# Patient Record
Sex: Female | Born: 1995 | Smoking: Never smoker
Health system: Southern US, Community
[De-identification: ages and names within clinical notes are randomized; demographics above are authoritative.]

## PROBLEM LIST (undated history)

## (undated) ENCOUNTER — Inpatient Hospital Stay (HOSPITAL_COMMUNITY): Payer: Self-pay

## (undated) DIAGNOSIS — R7989 Other specified abnormal findings of blood chemistry: Secondary | ICD-10-CM

## (undated) DIAGNOSIS — J45909 Unspecified asthma, uncomplicated: Secondary | ICD-10-CM

## (undated) DIAGNOSIS — O24419 Gestational diabetes mellitus in pregnancy, unspecified control: Secondary | ICD-10-CM

## (undated) DIAGNOSIS — O139 Gestational [pregnancy-induced] hypertension without significant proteinuria, unspecified trimester: Secondary | ICD-10-CM

## (undated) DIAGNOSIS — R748 Abnormal levels of other serum enzymes: Secondary | ICD-10-CM

## (undated) DIAGNOSIS — I10 Essential (primary) hypertension: Secondary | ICD-10-CM

## (undated) DIAGNOSIS — K8020 Calculus of gallbladder without cholecystitis without obstruction: Secondary | ICD-10-CM

## (undated) HISTORY — DX: Other specified abnormal findings of blood chemistry: R79.89

## (undated) HISTORY — DX: Abnormal levels of other serum enzymes: R74.8

## (undated) HISTORY — DX: Unspecified asthma, uncomplicated: J45.909

## (undated) HISTORY — DX: Essential (primary) hypertension: I10

## (undated) HISTORY — DX: Gestational diabetes mellitus in pregnancy, unspecified control: O24.419

## (undated) HISTORY — DX: Gestational (pregnancy-induced) hypertension without significant proteinuria, unspecified trimester: O13.9

## (undated) HISTORY — PX: WISDOM TOOTH EXTRACTION: SHX21

## (undated) HISTORY — DX: Calculus of gallbladder without cholecystitis without obstruction: K80.20

---

## 2019-03-25 DIAGNOSIS — Z3490 Encounter for supervision of normal pregnancy, unspecified, unspecified trimester: Secondary | ICD-10-CM | POA: Insufficient documentation

## 2019-03-27 LAB — OB RESULTS CONSOLE HIV ANTIBODY (ROUTINE TESTING): HIV: NONREACTIVE

## 2019-03-27 LAB — OB RESULTS CONSOLE ABO/RH: RH Type: NEGATIVE

## 2019-03-27 LAB — OB RESULTS CONSOLE ANTIBODY SCREEN: Antibody Screen: NEGATIVE

## 2019-03-27 LAB — OB RESULTS CONSOLE HEPATITIS B SURFACE ANTIGEN: Hepatitis B Surface Ag: NEGATIVE

## 2019-03-27 LAB — OB RESULTS CONSOLE GC/CHLAMYDIA
Chlamydia: NEGATIVE
Gonorrhea: NEGATIVE

## 2019-03-27 LAB — OB RESULTS CONSOLE RPR: RPR: NONREACTIVE

## 2019-03-27 LAB — OB RESULTS CONSOLE RUBELLA ANTIBODY, IGM: Rubella: IMMUNE

## 2019-04-05 NOTE — L&D Delivery Note (Addendum)
DELIVERY NOTE  Pt complete and at +2 station with urge to push. Epidural controlling pain. Pt pushed and delivered a viable female infant in LOA position. Anterior and posterior shoulders spontaneously delivered with next two pushes; body easily followed next. Infant placed on mothers abdomen and bulb suction of mouth and nose performed. Cord was then clamped and cut by FOB. Cord blood obtained, 3VC. Baby had a vigorous spontaneous cry noted. Placenta then delivered at 0322 intact. Fundal massage performed and pitocin per protocol. Fundus firm. The following lacerations were noted: right sulcal, second degree. Repaired in routine fashion with 2-0vicryl Mother and baby stable. Counts correct   Infant time: 0318 Gender: female Placenta time: 0322 Apgars: 9/9 Weight: pending skin-to-skin

## 2019-06-04 DIAGNOSIS — O99213 Obesity complicating pregnancy, third trimester: Secondary | ICD-10-CM | POA: Insufficient documentation

## 2019-07-12 ENCOUNTER — Inpatient Hospital Stay (HOSPITAL_COMMUNITY)
Admission: AD | Admit: 2019-07-12 | Payer: BC Managed Care – PPO | Source: Home / Self Care | Admitting: Obstetrics and Gynecology

## 2019-10-02 LAB — OB RESULTS CONSOLE GBS: GBS: POSITIVE

## 2019-10-11 ENCOUNTER — Telehealth (HOSPITAL_COMMUNITY): Payer: Self-pay | Admitting: *Deleted

## 2019-10-11 ENCOUNTER — Encounter (HOSPITAL_COMMUNITY): Payer: Self-pay | Admitting: *Deleted

## 2019-10-11 NOTE — Telephone Encounter (Signed)
Preadmission screen  

## 2019-10-14 ENCOUNTER — Encounter (HOSPITAL_COMMUNITY): Payer: Self-pay | Admitting: *Deleted

## 2019-10-22 ENCOUNTER — Other Ambulatory Visit (HOSPITAL_COMMUNITY)
Admission: RE | Admit: 2019-10-22 | Discharge: 2019-10-22 | Disposition: A | Discharge status: Home / Self Care | Payer: BC Managed Care – PPO | Source: Ambulatory Visit | Attending: Obstetrics and Gynecology | Admitting: Obstetrics and Gynecology

## 2019-10-22 DIAGNOSIS — Z20822 Contact with and (suspected) exposure to covid-19: Secondary | ICD-10-CM | POA: Insufficient documentation

## 2019-10-22 DIAGNOSIS — Z01812 Encounter for preprocedural laboratory examination: Secondary | ICD-10-CM | POA: Insufficient documentation

## 2019-10-22 LAB — SARS CORONAVIRUS 2 (TAT 6-24 HRS): SARS Coronavirus 2: NEGATIVE

## 2019-10-23 ENCOUNTER — Other Ambulatory Visit: Payer: Self-pay | Admitting: Obstetrics and Gynecology

## 2019-10-24 ENCOUNTER — Encounter (HOSPITAL_COMMUNITY): Payer: Self-pay | Admitting: Obstetrics and Gynecology

## 2019-10-24 ENCOUNTER — Other Ambulatory Visit: Payer: Self-pay

## 2019-10-24 ENCOUNTER — Inpatient Hospital Stay (HOSPITAL_COMMUNITY)
Admission: AD | Admit: 2019-10-24 | Discharge: 2019-10-26 | DRG: 807 | Disposition: A | Discharge status: Home / Self Care | Payer: BC Managed Care – PPO | Attending: Obstetrics and Gynecology | Admitting: Obstetrics and Gynecology

## 2019-10-24 ENCOUNTER — Inpatient Hospital Stay (HOSPITAL_COMMUNITY): Payer: BC Managed Care – PPO | Admitting: Anesthesiology

## 2019-10-24 ENCOUNTER — Inpatient Hospital Stay (HOSPITAL_COMMUNITY)
Admission: AD | Admit: 2019-10-24 | Payer: BC Managed Care – PPO | Source: Home / Self Care | Admitting: Obstetrics and Gynecology

## 2019-10-24 ENCOUNTER — Inpatient Hospital Stay (HOSPITAL_COMMUNITY): Payer: BC Managed Care – PPO

## 2019-10-24 DIAGNOSIS — O10919 Unspecified pre-existing hypertension complicating pregnancy, unspecified trimester: Secondary | ICD-10-CM | POA: Diagnosis present

## 2019-10-24 DIAGNOSIS — J45909 Unspecified asthma, uncomplicated: Secondary | ICD-10-CM | POA: Diagnosis present

## 2019-10-24 DIAGNOSIS — Z3A39 39 weeks gestation of pregnancy: Secondary | ICD-10-CM

## 2019-10-24 DIAGNOSIS — O99214 Obesity complicating childbirth: Secondary | ICD-10-CM | POA: Diagnosis present

## 2019-10-24 DIAGNOSIS — O1002 Pre-existing essential hypertension complicating childbirth: Principal | ICD-10-CM | POA: Diagnosis present

## 2019-10-24 DIAGNOSIS — O9952 Diseases of the respiratory system complicating childbirth: Secondary | ICD-10-CM | POA: Diagnosis present

## 2019-10-24 DIAGNOSIS — O26893 Other specified pregnancy related conditions, third trimester: Secondary | ICD-10-CM | POA: Diagnosis present

## 2019-10-24 DIAGNOSIS — Z6791 Unspecified blood type, Rh negative: Secondary | ICD-10-CM | POA: Diagnosis not present

## 2019-10-24 DIAGNOSIS — Z20822 Contact with and (suspected) exposure to covid-19: Secondary | ICD-10-CM | POA: Diagnosis present

## 2019-10-24 DIAGNOSIS — O99824 Streptococcus B carrier state complicating childbirth: Secondary | ICD-10-CM | POA: Diagnosis present

## 2019-10-24 LAB — CBC
HCT: 35.3 % — ABNORMAL LOW (ref 36.0–46.0)
HCT: 35.5 % — ABNORMAL LOW (ref 36.0–46.0)
Hemoglobin: 11.5 g/dL — ABNORMAL LOW (ref 12.0–15.0)
Hemoglobin: 11.6 g/dL — ABNORMAL LOW (ref 12.0–15.0)
MCH: 27.2 pg (ref 26.0–34.0)
MCH: 26.7 pg (ref 26.0–34.0)
MCHC: 32.6 g/dL (ref 30.0–36.0)
MCHC: 32.7 g/dL (ref 30.0–36.0)
MCV: 83.5 fL (ref 80.0–100.0)
MCV: 81.8 fL (ref 80.0–100.0)
Platelets: 237 10*3/uL (ref 150–400)
Platelets: 240 10*3/uL (ref 150–400)
RBC: 4.23 MIL/uL (ref 3.87–5.11)
RBC: 4.34 MIL/uL (ref 3.87–5.11)
RDW: 14.8 % (ref 11.5–15.5)
RDW: 14.9 % (ref 11.5–15.5)
WBC: 9.6 10*3/uL (ref 4.0–10.5)
WBC: 14 10*3/uL — ABNORMAL HIGH (ref 4.0–10.5)
nRBC: 0 % (ref 0.0–0.2)
nRBC: 0 % (ref 0.0–0.2)

## 2019-10-24 LAB — TYPE AND SCREEN
ABO/RH(D): O NEG
Antibody Screen: NEGATIVE

## 2019-10-24 LAB — ABO/RH: ABO/RH(D): O NEG

## 2019-10-24 LAB — RPR: RPR Ser Ql: NONREACTIVE

## 2019-10-24 MED ORDER — SOD CITRATE-CITRIC ACID 500-334 MG/5ML PO SOLN: 30.0000 mL | ORAL | Status: DC | PRN

## 2019-10-24 MED ORDER — FLEET ENEMA 7-19 GM/118ML RE ENEM: 1.0000 | ENEMA | RECTAL | Status: DC | PRN

## 2019-10-24 MED ORDER — LIDOCAINE HCL (PF) 1 % IJ SOLN
INTRAMUSCULAR | Status: DC | PRN
  Administered 2019-10-24 (×2): 5 mL via EPIDURAL

## 2019-10-24 MED ORDER — OXYTOCIN-SODIUM CHLORIDE 30-0.9 UT/500ML-% IV SOLN
1.0000 m[IU]/min | INTRAVENOUS | Status: DC
  Administered 2019-10-24: 2 m[IU]/min via INTRAVENOUS
  Filled 2019-10-24: qty 500

## 2019-10-24 MED ORDER — SODIUM CHLORIDE (PF) 0.9 % IJ SOLN
INTRAMUSCULAR | Status: DC | PRN
  Administered 2019-10-24: 12 mL/h via EPIDURAL

## 2019-10-24 MED ORDER — ACETAMINOPHEN 325 MG PO TABS: 650.0000 mg | ORAL_TABLET | ORAL | Status: DC | PRN

## 2019-10-24 MED ORDER — DIPHENHYDRAMINE HCL 50 MG/ML IJ SOLN: 12.5000 mg | INTRAMUSCULAR | Status: DC | PRN

## 2019-10-24 MED ORDER — OXYCODONE-ACETAMINOPHEN 5-325 MG PO TABS: 2.0000 | ORAL_TABLET | ORAL | Status: DC | PRN

## 2019-10-24 MED ORDER — OXYCODONE-ACETAMINOPHEN 5-325 MG PO TABS: 1.0000 | ORAL_TABLET | ORAL | Status: DC | PRN

## 2019-10-24 MED ORDER — OXYTOCIN BOLUS FROM INFUSION
333.0000 mL | Freq: Once | INTRAVENOUS | Status: AC
  Administered 2019-10-25: 333 mL via INTRAVENOUS

## 2019-10-24 MED ORDER — LACTATED RINGERS IV SOLN
500.0000 mL | Freq: Once | INTRAVENOUS | Status: AC
  Administered 2019-10-24: 500 mL via INTRAVENOUS

## 2019-10-24 MED ORDER — FENTANYL-BUPIVACAINE-NACL 0.5-0.125-0.9 MG/250ML-% EP SOLN
12.0000 mL/h | EPIDURAL | Status: DC | PRN
  Filled 2019-10-24: qty 250

## 2019-10-24 MED ORDER — PHENYLEPHRINE 40 MCG/ML (10ML) SYRINGE FOR IV PUSH (FOR BLOOD PRESSURE SUPPORT): 80.0000 ug | PREFILLED_SYRINGE | INTRAVENOUS | Status: DC | PRN

## 2019-10-24 MED ORDER — ONDANSETRON HCL 4 MG/2ML IJ SOLN: 4.0000 mg | Freq: Four times a day (QID) | INTRAMUSCULAR | Status: DC | PRN

## 2019-10-24 MED ORDER — LACTATED RINGERS IV SOLN: INTRAVENOUS | Status: DC

## 2019-10-24 MED ORDER — TERBUTALINE SULFATE 1 MG/ML IJ SOLN: 0.2500 mg | Freq: Once | INTRAMUSCULAR | Status: DC | PRN

## 2019-10-24 MED ORDER — OXYTOCIN-SODIUM CHLORIDE 30-0.9 UT/500ML-% IV SOLN: 2.5000 [IU]/h | INTRAVENOUS | Status: DC

## 2019-10-24 MED ORDER — LACTATED RINGERS IV SOLN: 500.0000 mL | INTRAVENOUS | Status: DC | PRN

## 2019-10-24 MED ORDER — PENICILLIN G POT IN DEXTROSE 60000 UNIT/ML IV SOLN
3.0000 10*6.[IU] | INTRAVENOUS | Status: DC
  Administered 2019-10-24 – 2019-10-25 (×4): 3 10*6.[IU] via INTRAVENOUS
  Filled 2019-10-24 (×4): qty 50

## 2019-10-24 MED ORDER — EPHEDRINE 5 MG/ML INJ: 10.0000 mg | INTRAVENOUS | Status: DC | PRN

## 2019-10-24 MED ORDER — LIDOCAINE HCL (PF) 1 % IJ SOLN
30.0000 mL | INTRAMUSCULAR | Status: AC | PRN
  Administered 2019-10-25: 30 mL via SUBCUTANEOUS
  Filled 2019-10-24: qty 30

## 2019-10-24 MED ORDER — MISOPROSTOL 25 MCG QUARTER TABLET
25.0000 ug | ORAL_TABLET | ORAL | Status: DC | PRN
  Administered 2019-10-24: 25 ug via VAGINAL
  Filled 2019-10-24: qty 1

## 2019-10-24 MED ORDER — SODIUM CHLORIDE 0.9 % IV SOLN
5.0000 10*6.[IU] | Freq: Once | INTRAVENOUS | Status: AC
  Administered 2019-10-24: 5 10*6.[IU] via INTRAVENOUS
  Filled 2019-10-24: qty 5

## 2019-10-24 NOTE — Progress Notes (Signed)
S/p clear AROM at 1340, initiate and titrate pitocin per protocol

## 2019-10-24 NOTE — Progress Notes (Signed)
Labor Note  S: FEelign some cramping, no other complaints  O: BP (!) 133/75   Pulse 86   Temp 98.6 F (37 C) (Oral)   Resp 16   Ht 5\' 6"  (1.676 m)   Wt 115.3 kg   BMI 41.03 kg/m  CE: 2/50/-3, softer and more anterior from admission FHR: Baseline 125, +accels, -decels, mod variability TOCO q2-58m  A/P: This is a 24 y.o. G1P0 at [redacted]w[redacted]d  admitted for IOL. CHTN no meds, GBS pos, RH neg, BMI 41 FWB: Cat 1 MWB: Comfortable Labor course: Cervical ripening. Will allow 2nd dose of PCN to start then plan on AROM and pitocin. S/p PV cytotec x1  Anticipate SVD

## 2019-10-24 NOTE — Progress Notes (Signed)
Feeling painful contractions, still not yet ready for epidural BP (!) 146/79   Pulse 87   Temp 98.9 F (37.2 C) (Oral)   Resp 19   Ht 5\' 6"  (1.676 m)   Wt 115.3 kg   BMI 41.03 kg/m  Ce 2/75/-3  Pitocin was at 6 now 4 mU/min 2/2 tachysystole. Now WNL, plan to titrate up.

## 2019-10-24 NOTE — Progress Notes (Signed)
Intermittent rectal rpessure with contractions BP (!) 139/71   Pulse 102   Temp 99 F (37.2 C) (Oral)   Resp 16   Ht 5\' 6"  (1.676 m)   Wt 115.3 kg   SpO2 96%   BMI 41.03 kg/m  Ce unchanged from RN exam at 1815, 3/75/-2 Pitocin at 10 mU/min, TOCO q39m IUPC placed, MVUs inadequate  Continue to titrate pitocin per protocol, trend MVUs. Afebrile, continue to monitor

## 2019-10-24 NOTE — H&P (Signed)
Brooke Oconnor is a 24 y.o. female presenting for scheduled IOL. +FM, denies VB, LOF, only occ ctx. PNC c/b obesity, RH neg, CHTN, asthma. OB History    Gravida  1   Para      Term      Preterm      AB      Living        SAB      TAB      Ectopic      Multiple      Live Births             Past Medical History:  Diagnosis Date  . Asthma   . Hypertension   . Pregnancy induced hypertension    Past Surgical History:  Procedure Laterality Date  . WISDOM TOOTH EXTRACTION     Family History: family history includes Diabetes in her maternal aunt, maternal grandmother, and mother; Heart disease in her paternal grandfather and paternal grandmother; Hypertension in her father, maternal grandmother, mother, paternal grandfather, and paternal grandmother. Social History:  reports that she has never smoked. She has never used smokeless tobacco. She reports previous alcohol use. She reports that she does not use drugs.     Maternal Diabetes: No Genetic Screening: Normal Maternal Ultrasounds/Referrals: Normal Fetal Ultrasounds or other Referrals:  None Maternal Substance Abuse:  No Significant Maternal Medications:  None Significant Maternal Lab Results:  Group B Strep positive Other Comments:  None  Review of Systems  Constitutional: Negative for chills and fever.  Respiratory: Negative for shortness of breath.   Cardiovascular: Negative for chest pain, palpitations and leg swelling.  Gastrointestinal: Negative for abdominal pain and vomiting.  Neurological: Negative for dizziness, weakness and headaches.  Psychiatric/Behavioral: Negative for suicidal ideas.   History   Blood pressure 136/77, pulse (!) 104, temperature 98.6 F (37 C), temperature source Oral, resp. rate 18, height 5\' 6"  (1.676 m), weight 115.3 kg. Exam Physical Exam Constitutional:      General: She is not in acute distress.    Appearance: She is well-developed.  HENT:     Head: Normocephalic  and atraumatic.  Eyes:     Pupils: Pupils are equal, round, and reactive to light.  Cardiovascular:     Rate and Rhythm: Normal rate and regular rhythm.     Heart sounds: No murmur heard.  No gallop.   Abdominal:     Tenderness: There is no abdominal tenderness. There is no guarding or rebound.  Genitourinary:    Vagina: Normal.  Musculoskeletal:        General: Normal range of motion.     Cervical back: Normal range of motion and neck supple.  Skin:    General: Skin is warm and dry.  Neurological:     Mental Status: She is alert and oriented to person, place, and time.     Prenatal labs: ABO, Rh: O/Negative/-- (12/23 0000) Antibody: Negative (12/23 0000) Rubella: Immune (12/23 0000) RPR: Nonreactive (12/23 0000)  HBsAg: Negative (12/23 0000)  HIV: Non-reactive (12/23 0000)  GBS: Positive/-- (06/30 0000)   Category 1 tracing, TOCO irr  Assessment/Plan: This is a 24yo G1P0 by LMP c/w 9 0/7 scan admitted for IOL for [facility] on no meds at term. PNC c/b GBS pos, RH neg (s/p rhogam 5/6), BMI 41. CE 1/50/-3, attempted FB but unable to place, PV cytotec x1 at this time.  Patient GS on 6/29 for S>D showed EFW 81.8%tile. Dystocia risks discussed with patient. Reviewed this occurs when head  delivers but anterior and posterior shoulders do not easily delivery with gentle traction. If this occurs, more personnel called in including NICU. Maneuvers may require break of bones, possible episiotomy or, in worst case, stat section. Higher chance of neonatal NICU admission if dystocia occurs. Patient understands and wishes to move forward with induction.   Brooke Oconnor 10/24/2019, 8:36 AM

## 2019-10-24 NOTE — Anesthesia Procedure Notes (Signed)
Epidural Patient location during procedure: OB Start time: 10/24/2019 5:26 PM End time: 10/24/2019 5:41 PM  Staffing Anesthesiologist: Heather Roberts, MD Performed: anesthesiologist   Preanesthetic Checklist Completed: patient identified, IV checked, site marked, risks and benefits discussed, monitors and equipment checked, pre-op evaluation and timeout performed  Epidural Patient position: sitting Prep: DuraPrep Patient monitoring: heart rate, cardiac monitor, continuous pulse ox and blood pressure Approach: midline Location: L2-L3 Injection technique: LOR saline  Needle:  Needle type: Tuohy  Needle gauge: 17 G Needle length: 9 cm Needle insertion depth: 6 cm Catheter size: 20 Guage Catheter at skin depth: 11 cm Test dose: negative and Other  Assessment Events: blood not aspirated, injection not painful, no injection resistance and negative IV test  Additional Notes Informed consent obtained prior to proceeding including risk of failure, 1% risk of PDPH, risk of minor discomfort and bruising.  Discussed rare but serious complications including epidural abscess, permanent nerve injury, epidural hematoma.  Discussed alternatives to epidural analgesia and patient desires to proceed.  Timeout performed pre-procedure verifying patient name, procedure, and platelet count.  Patient tolerated procedure well.

## 2019-10-24 NOTE — Anesthesia Preprocedure Evaluation (Signed)
Anesthesia Evaluation  Patient identified by MRN, date of birth, ID band Patient awake    Reviewed: Allergy & Precautions, NPO status , Patient's Chart, lab work & pertinent test results  Airway Mallampati: III  TM Distance: >3 FB Neck ROM: Full    Dental no notable dental hx. (+) Dental Advisory Given   Pulmonary neg pulmonary ROS,    Pulmonary exam normal        Cardiovascular hypertension, Normal cardiovascular exam     Neuro/Psych negative neurological ROS  negative psych ROS   GI/Hepatic negative GI ROS, Neg liver ROS,   Endo/Other  Morbid obesity  Renal/GU negative Renal ROS  negative genitourinary   Musculoskeletal negative musculoskeletal ROS (+)   Abdominal   Peds negative pediatric ROS (+)  Hematology negative hematology ROS (+)   Anesthesia Other Findings   Reproductive/Obstetrics (+) Pregnancy                             Anesthesia Physical Anesthesia Plan  ASA: III  Anesthesia Plan: Epidural   Post-op Pain Management:    Induction:   PONV Risk Score and Plan:   Airway Management Planned: Natural Airway  Additional Equipment:   Intra-op Plan:   Post-operative Plan:   Informed Consent: I have reviewed the patients History and Physical, chart, labs and discussed the procedure including the risks, benefits and alternatives for the proposed anesthesia with the patient or authorized representative who has indicated his/her understanding and acceptance.     Dental advisory given  Plan Discussed with: Anesthesiologist  Anesthesia Plan Comments:         Anesthesia Quick Evaluation

## 2019-10-25 LAB — CBC
HCT: 32.6 % — ABNORMAL LOW (ref 36.0–46.0)
HCT: 29.1 % — ABNORMAL LOW (ref 36.0–46.0)
Hemoglobin: 10.9 g/dL — ABNORMAL LOW (ref 12.0–15.0)
Hemoglobin: 9.4 g/dL — ABNORMAL LOW (ref 12.0–15.0)
MCH: 27.3 pg (ref 26.0–34.0)
MCH: 26.7 pg (ref 26.0–34.0)
MCHC: 33.4 g/dL (ref 30.0–36.0)
MCHC: 32.3 g/dL (ref 30.0–36.0)
MCV: 81.5 fL (ref 80.0–100.0)
MCV: 82.7 fL (ref 80.0–100.0)
Platelets: 228 10*3/uL (ref 150–400)
Platelets: 198 10*3/uL (ref 150–400)
RBC: 4 MIL/uL (ref 3.87–5.11)
RBC: 3.52 MIL/uL — ABNORMAL LOW (ref 3.87–5.11)
RDW: 14.6 % (ref 11.5–15.5)
RDW: 14.9 % (ref 11.5–15.5)
WBC: 20.6 10*3/uL — ABNORMAL HIGH (ref 4.0–10.5)
WBC: 14.3 10*3/uL — ABNORMAL HIGH (ref 4.0–10.5)
nRBC: 0 % (ref 0.0–0.2)
nRBC: 0 % (ref 0.0–0.2)

## 2019-10-25 LAB — COMPREHENSIVE METABOLIC PANEL
ALT: 13 U/L (ref 0–44)
AST: 22 U/L (ref 15–41)
Albumin: 2.2 g/dL — ABNORMAL LOW (ref 3.5–5.0)
Alkaline Phosphatase: 93 U/L (ref 38–126)
Anion gap: 9 (ref 5–15)
BUN: 8 mg/dL (ref 6–20)
CO2: 24 mmol/L (ref 22–32)
Calcium: 9.1 mg/dL (ref 8.9–10.3)
Chloride: 105 mmol/L (ref 98–111)
Creatinine, Ser: 1.02 mg/dL — ABNORMAL HIGH (ref 0.44–1.00)
GFR calc Af Amer: >60 mL/min (ref >60)
GFR calc non Af Amer: >60 mL/min (ref >60)
Glucose, Bld: 92 mg/dL (ref 70–99)
Potassium: 3.7 mmol/L (ref 3.5–5.1)
Sodium: 138 mmol/L (ref 135–145)
Total Bilirubin: 0.2 mg/dL — ABNORMAL LOW (ref 0.3–1.2)
Total Protein: 5.3 g/dL — ABNORMAL LOW (ref 6.5–8.1)

## 2019-10-25 MED ORDER — ONDANSETRON HCL 4 MG PO TABS: 4.0000 mg | ORAL_TABLET | ORAL | Status: DC | PRN

## 2019-10-25 MED ORDER — ACETAMINOPHEN 325 MG PO TABS: 650.0000 mg | ORAL_TABLET | ORAL | Status: DC | PRN

## 2019-10-25 MED ORDER — SIMETHICONE 80 MG PO CHEW: 80.0000 mg | CHEWABLE_TABLET | ORAL | Status: DC | PRN

## 2019-10-25 MED ORDER — ONDANSETRON HCL 4 MG/2ML IJ SOLN: 4.0000 mg | INTRAMUSCULAR | Status: DC | PRN

## 2019-10-25 MED ORDER — NIFEDIPINE ER OSMOTIC RELEASE 30 MG PO TB24
30.0000 mg | ORAL_TABLET | Freq: Every day | ORAL | Status: DC
  Administered 2019-10-25 – 2019-10-26 (×2): 30 mg via ORAL
  Filled 2019-10-25 (×2): qty 1

## 2019-10-25 MED ORDER — SENNOSIDES-DOCUSATE SODIUM 8.6-50 MG PO TABS
2.0000 | ORAL_TABLET | ORAL | Status: DC
  Administered 2019-10-26: 2 via ORAL
  Filled 2019-10-25: qty 2

## 2019-10-25 MED ORDER — ZOLPIDEM TARTRATE 5 MG PO TABS: 5.0000 mg | ORAL_TABLET | Freq: Every evening | ORAL | Status: DC | PRN

## 2019-10-25 MED ORDER — DIPHENHYDRAMINE HCL 25 MG PO CAPS: 25.0000 mg | ORAL_CAPSULE | Freq: Four times a day (QID) | ORAL | Status: DC | PRN

## 2019-10-25 MED ORDER — IBUPROFEN 600 MG PO TABS
600.0000 mg | ORAL_TABLET | Freq: Four times a day (QID) | ORAL | Status: DC
  Administered 2019-10-25 – 2019-10-26 (×6): 600 mg via ORAL
  Filled 2019-10-25 (×6): qty 1

## 2019-10-25 MED ORDER — BENZOCAINE-MENTHOL 20-0.5 % EX AERO
1.0000 "application " | INHALATION_SPRAY | CUTANEOUS | Status: DC | PRN
  Administered 2019-10-25 – 2019-10-26 (×2): 1 via TOPICAL
  Filled 2019-10-25 (×2): qty 56

## 2019-10-25 MED ORDER — PRENATAL MULTIVITAMIN CH
1.0000 | ORAL_TABLET | Freq: Every day | ORAL | Status: DC
  Administered 2019-10-25 – 2019-10-26 (×2): 1 via ORAL
  Filled 2019-10-25 (×2): qty 1

## 2019-10-25 MED ORDER — WITCH HAZEL-GLYCERIN EX PADS: 1.0000 "application " | MEDICATED_PAD | CUTANEOUS | Status: DC | PRN

## 2019-10-25 MED ORDER — DIBUCAINE (PERIANAL) 1 % EX OINT: 1.0000 "application " | TOPICAL_OINTMENT | CUTANEOUS | Status: DC | PRN

## 2019-10-25 MED ORDER — COCONUT OIL OIL: 1.0000 "application " | TOPICAL_OIL | Status: DC | PRN

## 2019-10-25 NOTE — Lactation Note (Signed)
This note was copied from a baby's chart. Lactation Consultation Note  Patient Name: Brooke Tanea Oconnor Today's Date: 10/25/2019 Reason for consult: Initial assessment;Term;Primapara;1st time breastfeeding  P1 mother whose infant is now 11 hours old.  This is a term baby at 39+2 weeks.  Mother was sitting in the chair and getting ready to feed baby.  Offered to assist and mother accepted.  Baby had on a onesie; asked permission to remove clothing and feed STS.  Mother agreeable.  Discussed the benefits of feeding STS.    Mother's breasts are soft and non tender and nipples are short shafted, everted and intact.  Mother was unable to express any colostrum drops at this time.  Colostrum container provided and milk storage times reviewed.  Finger feeding demonstrated.  Encouraged to feed 8-12 times/24 hours or sooner if desired.  Mother will call her RN/LC for latch assistance as needed.  Basic breast feeding concepts reviewed included STS, tummy size, latching and how to awaken a sleepy baby.  Suggested mother continue hand expression before/after feedings to help stimulate breasts.    Mom made aware of O/P services, breastfeeding support groups, community resources, and our phone # for post-discharge questions. Mother has a DEBP for home use.  Father and grandmother are good support people for mother.      Maternal Data Formula Feeding for Exclusion: No Has patient been taught Hand Expression?: Yes Does the patient have breastfeeding experience prior to this delivery?: No  Feeding Feeding Type: Breast Fed  LATCH Score Latch: Too sleepy or reluctant, no latch achieved, no sucking elicited.  Audible Swallowing: None  Type of Nipple: Everted at rest and after stimulation (short shafted)  Comfort (Breast/Nipple): Soft / non-tender  Hold (Positioning): Assistance needed to correctly position infant at breast and maintain latch.  LATCH Score: 5  Interventions Interventions: Breast  feeding basics reviewed;Assisted with latch;Skin to skin;Breast massage;Hand express;Breast compression;Adjust position;Hand pump;Position options;Support pillows  Lactation Tools Discussed/Used Pump Review: Setup, frequency, and cleaning;Milk Storage Initiated by:: Javan Gonzaga Date initiated:: 10/25/19   Consult Status Consult Status: Follow-up Date: 10/26/19 Follow-up type: In-patient    Tiney Zipper R Sevannah Madia 10/25/2019, 2:01 PM

## 2019-10-25 NOTE — Progress Notes (Signed)
Contacted Dr. Ellyn Hack regarding trending blood pressures of patient in 140s/80s, no other s/s. Pt hx of CHTN. Verbal order to start oral procardia 30mg  and order for CBC and CMP.

## 2019-10-25 NOTE — Anesthesia Postprocedure Evaluation (Signed)
Anesthesia Post Note  Patient: Brooke Oconnor  Procedure(s) Performed: AN AD HOC LABOR EPIDURAL     Patient location during evaluation: Mother Baby Anesthesia Type: Epidural Level of consciousness: awake and awake and alert Pain management: pain level controlled Vital Signs Assessment: post-procedure vital signs reviewed and stable Respiratory status: spontaneous breathing Cardiovascular status: blood pressure returned to baseline Postop Assessment: no headache, no backache, epidural receding, patient able to bend at knees, able to ambulate, adequate PO intake and no apparent nausea or vomiting Anesthetic complications: no   No complications documented.  Last Vitals:  Vitals:   10/25/19 0545 10/25/19 0645  BP: (!) 143/79 (!) 140/64  Pulse: (!) 108 (!) 110  Resp: 18 18  Temp: 37.1 C 37.2 C  SpO2: 99% 98%    Last Pain:  Vitals:   10/25/19 0730  TempSrc:   PainSc: 0-No pain   Pain Goal:                Epidural/Spinal Function Cutaneous sensation: Normal sensation (10/25/19 0730), Patient able to flex knees: Yes (10/25/19 0730), Patient able to lift hips off bed: Yes (10/25/19 0730), Back pain beyond tenderness at insertion site: No (10/25/19 0730), Progressively worsening motor and/or sensory loss: No (10/25/19 0730), Bowel and/or bladder incontinence post epidural: No (10/25/19 0730)  Jennelle Human

## 2019-10-25 NOTE — Progress Notes (Signed)
Post Partum Day 0 Subjective: no complaints, up ad lib, voiding, tolerating PO and nl lochia, pain controleld  Objective: Blood pressure (!) 140/64, pulse (!) 110, temperature 98.9 F (37.2 C), temperature source Oral, resp. rate 18, height 5\' 6"  (1.676 m), weight 115.3 kg, SpO2 98 %.  Physical Exam:  General: alert and no distress Lochia: appropriate Uterine Fundus: firm  Recent Labs    10/24/19 1451 10/25/19 0449  HGB 11.6* 10.9*  HCT 35.5* 32.6*    Assessment/Plan: Breastfeeding and Lactation consult.  Routine PP care.     LOS: 1 day   Brooke Oconnor 10/25/2019, 7:59 AM

## 2019-10-25 NOTE — Progress Notes (Signed)
Patient feeling strong urge to push, rectal pressure, Complete/+1. TOCO q44m, pit at 12 mU/min. Set up at this time BP 128/65   Pulse 103   Temp 98.8 F (37.1 C) (Oral)   Resp 16   Ht 5\' 6"  (1.676 m)   Wt 115.3 kg   SpO2 96%   BMI 41.03 kg/m

## 2019-10-26 MED ORDER — PRENATAL 27-0.8 MG PO TABS: 1.0000 | ORAL_TABLET | Freq: Every day | ORAL | 3 refills | Status: DC

## 2019-10-26 MED ORDER — IBUPROFEN 600 MG PO TABS: 600.0000 mg | ORAL_TABLET | Freq: Four times a day (QID) | ORAL | 1 refills | Status: DC

## 2019-10-26 MED ORDER — NIFEDIPINE ER 30 MG PO TB24: 30.0000 mg | ORAL_TABLET | Freq: Every day | ORAL | 2 refills | Status: DC

## 2019-10-26 NOTE — Lactation Note (Signed)
This note was copied from a baby's chart. Lactation Consultation Note  Patient Name: Brooke Oconnor [id number] Date: 10/26/2019 Reason for consult: Follow-up assessment   P1, Baby 32 hours old.  Reviewed hand expression and positioning w/ mother. Feed on demand with cues.  Goal 8-12+ times per day after first 24 hrs.  Place baby STS if not cueing.  Suggest hand expressing before latching and post pumping a few times a day after breastfeeding to increase mother's milk supply until first weight check.  Give volume back to baby on spoon and later w/ slow flow nipple.  Reviewed engorgement care and monitoring voids/stools.    Maternal Data Has patient been taught Hand Expression?: Yes Does the patient have breastfeeding experience prior to this delivery?: No  Feeding Feeding Type: Breast Fed  LATCH Score Latch: Grasps breast easily, tongue down, lips flanged, rhythmical sucking.  Audible Swallowing: A few with stimulation  Type of Nipple: Everted at rest and after stimulation  Comfort (Breast/Nipple): Soft / non-tender  Hold (Positioning): Assistance needed to correctly position infant at breast and maintain latch.  LATCH Score: 8  Interventions Interventions: Breast feeding basics reviewed;Assisted with latch;Support pillows  Lactation Tools Discussed/Used     Consult Status Consult Status: Complete Date: 10/26/19    Dahlia Byes [facility] 10/26/2019, 12:08 PM

## 2019-10-26 NOTE — Progress Notes (Signed)
Post Partum Day 1 Subjective: no complaints, up ad lib, voiding, tolerating PO and nl lochia, pain controlled.  Tolerating blood loss well.  Elevated BP in PNC 130-150/70-80;s. Started on Procardia 30 daily yesterday.    Objective: Blood pressure 127/82, pulse 98, temperature 98 F (36.7 C), temperature source Oral, resp. rate 18, height 5\' 6"  (1.676 m), weight 115.3 kg, SpO2 100 %.  Physical Exam:  General: alert and no distress Lochia: appropriate Uterine Fundus: firm   Recent Labs    10/25/19 0449 10/25/19 1806  HGB 10.9* 9.4*  HCT 32.6* 29.1*    Assessment/Plan: Plan for discharge tomorrow, Breastfeeding and Lactation consult.  Routine care.   Desires d/c home.  Can follow BP at home, no sx's.  Will d/c with Motrin, Procardia and PNV.  F/u 1 and 6 weeks.    LOS: 2 days   Brooke Oconnor 10/26/2019, 6:46 AM

## 2019-10-26 NOTE — Discharge Summary (Signed)
Postpartum Discharge Summary     Patient Name: Brooke Oconnor DOB: 10/13/1995 MRN: [id number]  Date of admission: 10/24/2019 Delivery date:10/25/2019  Delivering provider: Deliah Boston  Date of discharge: 10/26/2019  Admitting diagnosis: Chronic hypertension affecting pregnancy [O10.919] Intrauterine pregnancy: [redacted]w[redacted]d    Secondary diagnosis:  Active Problems:   Chronic hypertension affecting pregnancy  Additional problems: N/A    Discharge diagnosis: Term Pregnancy Delivered                                              Post partum procedures:none - baby o neg Augmentation: AROM, Pitocin and Cytotec Complications: None  Hospital course: Induction of Labor With Vaginal Delivery   24y.o. yo G1P0 at 355w3das admitted to the hospital 10/24/2019 for induction of labor.  Indication for induction: CHTN.  Patient had an uncomplicated labor course as follows: Membrane Rupture Time/Date: 1:38 PM ,10/24/2019   Delivery Method:Vaginal, Spontaneous  Episiotomy: None  Lacerations:  Sulcus;2nd degree  Details of delivery can be found in separate delivery note.  Patient had a routine postpartum course. Patient is discharged home 10/26/19.  Newborn Data: Birth date:10/25/2019  Birth time:3:18 AM  Gender:Female  Living status:Living  Apgars:8 ,9  Weight:4006 g   Magnesium Sulfate received: No BMZ received: No Rhophylac:No MMR:No T-DaP:allergic Flu: N/A Transfusion:No  Physical exam  Vitals:   10/25/19 1444 10/25/19 1810 10/25/19 2152 10/26/19 0526  BP: (!) 140/69 (!) 131/81 (!) 134/83 127/82  Pulse:  98 99 98  Resp: '18 18 18 18  ' Temp: 98.8 F (37.1 C) 98.1 F (36.7 C) 98 F (36.7 C) 98 F (36.7 C)  TempSrc: Oral Oral Oral Oral  SpO2: 99% 100% 100% 100%  Weight:      Height:       General: alert, cooperative and no distress Lochia: appropriate Uterine Fundus: firm  Labs: Lab Results  Component Value Date   WBC 14.3 (H) 10/25/2019   HGB 9.4 (L) 10/25/2019   HCT  29.1 (L) 10/25/2019   MCV 82.7 10/25/2019   PLT 198 10/25/2019   CMP Latest Ref Rng & Units 10/25/2019  Glucose 70 - 99 mg/dL 92  BUN 6 - 20 mg/dL 8  Creatinine 0.44 - 1.00 mg/dL 1.02(H)  Sodium 135 - 145 mmol/L 138  Potassium 3.5 - 5.1 mmol/L 3.7  Chloride 98 - 111 mmol/L 105  CO2 22 - 32 mmol/L 24  Calcium 8.9 - 10.3 mg/dL 9.1  Total Protein 6.5 - 8.1 g/dL 5.3(L)  Total Bilirubin 0.3 - 1.2 mg/dL 0.2(L)  Alkaline Phos 38 - 126 U/L 93  AST 15 - 41 U/L 22  ALT 0 - 44 U/L 13   Edinburgh Score: Edinburgh Postnatal Depression Scale Screening Tool 10/25/2019  I have been able to laugh and see the funny side of things. (No Data)     After visit meds:  Allergies as of 10/26/2019      Reactions   Pertussis Vaccines       Medication List    STOP taking these medications   acetaminophen 325 MG tablet Commonly known as: TYLENOL   aspirin EC 81 MG tablet     TAKE these medications   ibuprofen 600 MG tablet Commonly known as: ADVIL Take 1 tablet (600 mg total) by mouth every 6 (six) hours.   multivitamin-prenatal 27-0.8 MG Tabs tablet Take  1 tablet by mouth daily at 12 noon.   NIFEdipine 30 MG 24 hr tablet Commonly known as: ADALAT CC Take 1 tablet (30 mg total) by mouth daily.        Discharge home in stable condition Infant Feeding: Breast Infant Disposition:home with mother Discharge instruction: per After Visit Summary and Postpartum booklet. Activity: Advance as tolerated. Pelvic rest for 6 weeks.  Diet: routine diet Future Appointments:No future appointments. Follow up Visit:  Follow-up Information    Shivaji, Melida Quitter, MD. Schedule an appointment as soon as possible for a visit in 1 week(s).   Specialty: Obstetrics and Gynecology Why: for BP check and 6 weeks for postpartum check Contact information: [address] [place] [address] [phone]                F/u 1 week for BP check 6week for PP check  10/26/2019 Janyth Contes, MD

## 2019-12-16 ENCOUNTER — Encounter: Payer: Self-pay | Admitting: *Deleted

## 2019-12-17 ENCOUNTER — Ambulatory Visit (INDEPENDENT_AMBULATORY_CARE_PROVIDER_SITE_OTHER): Payer: BC Managed Care – PPO | Admitting: Internal Medicine

## 2019-12-17 ENCOUNTER — Encounter: Payer: Self-pay | Admitting: Internal Medicine

## 2019-12-17 ENCOUNTER — Other Ambulatory Visit (INDEPENDENT_AMBULATORY_CARE_PROVIDER_SITE_OTHER): Payer: BC Managed Care – PPO

## 2019-12-17 VITALS — BP 114/70 | HR 60 | Ht 66.0 in | Wt 218.6 lb

## 2019-12-17 DIAGNOSIS — R748 Abnormal levels of other serum enzymes: Secondary | ICD-10-CM | POA: Diagnosis not present

## 2019-12-17 DIAGNOSIS — R1013 Epigastric pain: Secondary | ICD-10-CM | POA: Diagnosis not present

## 2019-12-17 LAB — COMPREHENSIVE METABOLIC PANEL
ALT: 27 U/L (ref 0–35)
AST: 18 U/L (ref 0–37)
Albumin: 4.4 g/dL (ref 3.5–5.2)
Alkaline Phosphatase: 162 U/L — ABNORMAL HIGH (ref 39–117)
BUN: 16 mg/dL (ref 6–23)
CO2: 26 mEq/L (ref 19–32)
Calcium: 9.6 mg/dL (ref 8.4–10.5)
Chloride: 106 mEq/L (ref 96–112)
Creatinine, Ser: 0.79 mg/dL (ref 0.40–1.20)
GFR: 87.78 mL/min (ref >60.00)
Glucose, Bld: 87 mg/dL (ref 70–99)
Potassium: 4.1 mEq/L (ref 3.5–5.1)
Sodium: 140 mEq/L (ref 135–145)
Total Bilirubin: 0.3 mg/dL (ref 0.2–1.2)
Total Protein: 7.4 g/dL (ref 6.0–8.3)

## 2019-12-17 LAB — CBC WITH DIFFERENTIAL/PLATELET
Basophils Absolute: 0 10*3/uL (ref 0.0–0.1)
Basophils Relative: 0.3 % (ref 0.0–3.0)
Eosinophils Absolute: 0.1 10*3/uL (ref 0.0–0.7)
Eosinophils Relative: 1.9 % (ref 0.0–5.0)
HCT: 38.8 % (ref 36.0–46.0)
Hemoglobin: 13.1 g/dL (ref 12.0–15.0)
Lymphocytes Relative: 33.4 % (ref 12.0–46.0)
Lymphs Abs: 2.2 10*3/uL (ref 0.7–4.0)
MCHC: 33.7 g/dL (ref 30.0–36.0)
MCV: 80.2 fl (ref 78.0–100.0)
Monocytes Absolute: 0.6 10*3/uL (ref 0.1–1.0)
Monocytes Relative: 8.5 % (ref 3.0–12.0)
Neutro Abs: 3.7 10*3/uL (ref 1.4–7.7)
Neutrophils Relative %: 55.9 % (ref 43.0–77.0)
Platelets: 241 10*3/uL (ref 150.0–400.0)
RBC: 4.84 Mil/uL (ref 3.87–5.11)
RDW: 15.5 % (ref 11.5–15.5)
WBC: 6.6 10*3/uL (ref 4.0–10.5)

## 2019-12-17 LAB — PROTIME-INR
INR: 1.1 ratio — ABNORMAL HIGH (ref 0.8–1.0)
Prothrombin Time: 11.9 s (ref 9.6–13.1)

## 2019-12-17 LAB — FERRITIN: Ferritin: 36.4 ng/mL (ref 10.0–291.0)

## 2019-12-17 NOTE — Patient Instructions (Addendum)
If you are age 26 or older, your body mass index should be between 23-30. Your Body mass index is 35.28 kg/m. If this is out of the aforementioned range listed, please consider follow up with your Primary Care Provider.  If you are age 75 or younger, your body mass index should be between 19-25. Your Body mass index is 35.28 kg/m. If this is out of the aformentioned range listed, please consider follow up with your Primary Care Provider.   Your provider has requested that you go to the basement level for lab work before leaving today. Press "B" on the elevator. The lab is located at the first door on the left as you exit the elevator.  You have been scheduled for an abdominal ultrasound at [facility] (1st floor of hospital) on 12/23/19 at 11:30am. Please arrive 15 minutes prior to your appointment for registration. Make certain not to have anything to eat or drink 6 hours prior to your appointment. Should you need to reschedule your appointment, please contact radiology at [phone]. This test typically takes about 30 minutes to perform.  Continue Colace as needed for constipation

## 2019-12-17 NOTE — Progress Notes (Signed)
Patient ID: Brooke Oconnor, female   DOB: 1995-09-10, 24 y.o.   MRN: [id number] HPI: Brooke Oconnor is a 24 year old female with a history of asthma, recent pregnancy status post spontaneous vaginal delivery on 10/25/2019, hypertension during pregnancy who is seen in consultation at the request of Dr. Venetia Maxon to evaluate elevated liver enzymes and constipation.  She is here alone today.  She reports that she had her first child, a daughter, on 10/25/2019.  This was a spontaneous vaginal delivery with a second-degree vaginal tear which was repaired.  The child was healthy and she has been breast-feeding.  Her postpartum period was unremarkable until 11/11/2019 when she developed epigastric abdominal pain.  This epigastric pain was crescendo in nature and became severe rated 10 out of 10.  It radiated to her back between her shoulder blades.  There was associated nausea and an episode of vomiting.  The vomiting seemed to help the epigastric pressure but the pain did not completely resolved.  She was transported to the emergency room at [facility].  Labs there showed a mild elevation in AST at 43 and alk phos was elevated at 155.  White count was slightly elevated at 13.3 while hemoglobin and platelets were normal.  Chest x-ray was done which was unremarkable and she left before being further evaluated.  At that time the triage nurse suggested that this may have been constipation related.  The painful episode did finally resolve after about 12 hours.  She is subsequently had intermittent attacks though not as severe as that first.  She had another attack on 11/14/2019.  And has had this occur roughly once a week since then.  Associated with mild nausea but no other vomiting episodes.  Pain can rate on a scale from 7-8 out of 10.  Still is crescendo decrescendo and tends to happen in the evening.  Has not happened in the middle of the night.  She has been on Colace and/or MiraLAX since her initial episode.  Most recently Colace  most days of the week.  Stools have normalized for the most part and are typically daily.  Occasionally she can have a harder stool.  She had additional lab work where her liver enzymes became more elevated as follows: Labs dated 11/13/2019 --AST 38, ALT 114, total bili 0.2, alkaline phosphatase 185 Labs dated 11/20/2019 --AST 595, ALT 553, total bili 0.2, alkaline phosphatase 329 Labs dated 11/26/2019 --AST 20, ALT 68, total bili 0.2, alkaline phosphatase 199    Past Medical History:  Diagnosis Date  . Asthma   . Elevated alkaline phosphatase level   . Elevated LFTs   . Hypertension   . Pregnancy induced hypertension     Past Surgical History:  Procedure Laterality Date  . WISDOM TOOTH EXTRACTION      Outpatient Medications Prior to Visit  Medication Sig Dispense Refill  . docusate sodium (COLACE) 100 MG capsule Take 100 mg by mouth daily.    Marland Kitchen ibuprofen (ADVIL) 600 MG tablet Take 1 tablet (600 mg total) by mouth every 6 (six) hours. 45 tablet 1  . NIFEdipine (ADALAT CC) 30 MG 24 hr tablet Take 1 tablet (30 mg total) by mouth daily. 30 tablet 2  . Prenatal Vit-Fe Fumarate-FA (MULTIVITAMIN-PRENATAL) 27-0.8 MG TABS tablet Take 1 tablet by mouth daily at 12 noon. 100 tablet 3   No facility-administered medications prior to visit.    Allergies  Allergen Reactions  . Pertussis Vaccines     Family History  Problem Relation Age of  Onset  . Diabetes Mother   . Hypertension Mother   . Thyroid disease Mother   . Hypertension Father   . Diabetes Maternal Aunt   . Thyroid disease Maternal Aunt   . Diabetes Maternal Grandmother   . Hypertension Maternal Grandmother   . Thyroid disease Maternal Grandmother   . Heart attack Maternal Grandmother   . Heart disease Paternal Grandmother   . Hypertension Paternal Grandmother   . Heart disease Paternal Grandfather   . Hypertension Paternal Grandfather   . Diabetes Brother   . Lung cancer Maternal Grandfather   . Heart attack Maternal  Grandfather     Social History   Tobacco Use  . Smoking status: Never Smoker  . Smokeless tobacco: Never Used  Vaping Use  . Vaping Use: Never used  Substance Use Topics  . Alcohol use: Not Currently  . Drug use: Never    ROS: As per history of present illness, otherwise negative  BP 114/70   Pulse 60   Ht _0  (1.676 m)   Wt 218 lb 9.6 oz (99.2 kg)   BMI 35.28 kg/m  Constitutional: Well-developed and well-nourished. No distress. HEENT: Normocephalic and atraumatic. Conjunctivae are normal.  No scleral icterus. Neck: Neck supple. Trachea midline. Cardiovascular: Normal rate, regular rhythm and intact distal pulses. No M/R/G Pulmonary/chest: Effort normal and breath sounds normal. No wheezing, rales or rhonchi. Abdominal: Soft, nontender, nondistended. Bowel sounds active throughout. There are no masses palpable. No hepatosplenomegaly. Extremities: no clubbing, cyanosis, or edema Neurological: Alert and oriented to person place and time. Skin: Skin is warm and dry.  Psychiatric: Normal mood and affect. Behavior is normal.  RELEVANT LABS AND IMAGING: CBC    Component Value Date/Time   WBC 6.6 12/17/2019 0929   RBC 4.84 12/17/2019 0929   HGB 13.1 12/17/2019 0929   HCT 38.8 12/17/2019 0929   PLT 241.0 12/17/2019 0929   MCV 80.2 12/17/2019 0929   MCH 26.7 10/25/2019 1806   MCHC 33.7 12/17/2019 0929   RDW 15.5 12/17/2019 0929   LYMPHSABS 2.2 12/17/2019 0929   MONOABS 0.6 12/17/2019 0929   EOSABS 0.1 12/17/2019 0929   BASOSABS 0.0 12/17/2019 0929    CMP     Component Value Date/Time   NA 140 12/17/2019 0929   K 4.1 12/17/2019 0929   CL 106 12/17/2019 0929   CO2 26 12/17/2019 0929   GLUCOSE 87 12/17/2019 0929   BUN 16 12/17/2019 0929   CREATININE 0.79 12/17/2019 0929   CALCIUM 9.6 12/17/2019 0929   PROT 7.4 12/17/2019 0929   ALBUMIN 4.4 12/17/2019 0929   AST 18 12/17/2019 0929   ALT 27 12/17/2019 0929   ALKPHOS 162 (H) 12/17/2019 0929   BILITOT 0.3  12/17/2019 0929   GFRNONAA >60 10/25/2019 1806   GFRAA >60 10/25/2019 1806   Lab Results  Component Value Date   INR 1.1 (H) 12/17/2019    ASSESSMENT/PLAN: 24 year old female with a history of asthma, recent pregnancy status post spontaneous vaginal delivery on 10/25/2019, hypertension during pregnancy who is seen in consultation at the request of Dr. Venetia Maxon to evaluate elevated liver enzymes and constipation.  1.  Elevated liver enzymes/epigastric abdominal pain episodes --it is notable important that her pain and liver enzyme abnormality occurred after delivery rather than before the birth of her child.  She had elevation in liver enzymes dramatically several days after her attack of abdominal pain on 11/11/2019.  It took several weeks for these to normalize.  I am suspicious for  gallbladder disease and possibly choledocholithiasis.  I have recommended the following: --Repeat labs today but also labs to exclude other causes of liver inflammation (viral hepatitis studies, autoimmune and genetic liver evaluation) --Abdominal ultrasound, evaluate for gallstones and biliary ductal dilatation/CBD stones --MRI if abdominal ultrasound inconclusive --I recommended relative low-fat diet and to let me know if she has another severe attack of abdominal pain --Further recommendations after imaging and lab work available     GY:[facility], [address] [place],  [place] [address]

## 2019-12-20 LAB — ANTI-NUCLEAR AB-TITER (ANA TITER): ANA Titer 1: 1:40 {titer} — ABNORMAL HIGH

## 2019-12-20 LAB — HEPATITIS C ANTIBODY
Hepatitis C Ab: NONREACTIVE
SIGNAL TO CUT-OFF: 0.04 (ref <1.00)

## 2019-12-20 LAB — IGG: IgG (Immunoglobin G), Serum: 1067 mg/dL (ref 600–1640)

## 2019-12-20 LAB — ANA: Anti Nuclear Antibody (ANA): POSITIVE — AB

## 2019-12-20 LAB — ALPHA-1-ANTITRYPSIN: A-1 Antitrypsin, Ser: 126 mg/dL (ref 83–199)

## 2019-12-20 LAB — HEPATITIS B SURFACE ANTIBODY,QUALITATIVE: Hep B S Ab: NONREACTIVE

## 2019-12-20 LAB — HEPATITIS A ANTIBODY, TOTAL: Hepatitis A AB,Total: NONREACTIVE

## 2019-12-20 LAB — MITOCHONDRIAL ANTIBODIES: Mitochondrial M2 Ab, IgG: <=20 U

## 2019-12-20 LAB — ANTI-SMOOTH MUSCLE ANTIBODY, IGG: Actin (Smooth Muscle) Antibody (IGG): <20 U (ref <20)

## 2019-12-20 LAB — HEPATITIS B CORE ANTIBODY, TOTAL: Hep B Core Total Ab: NONREACTIVE

## 2019-12-20 LAB — HEPATITIS B SURFACE ANTIGEN: Hepatitis B Surface Ag: NONREACTIVE

## 2019-12-23 ENCOUNTER — Ambulatory Visit (HOSPITAL_COMMUNITY)
Admission: RE | Admit: 2019-12-23 | Discharge: 2019-12-23 | Disposition: A | Discharge status: Home / Self Care | Payer: BC Managed Care – PPO | Source: Ambulatory Visit | Attending: Internal Medicine | Admitting: Internal Medicine

## 2019-12-23 ENCOUNTER — Other Ambulatory Visit: Payer: Self-pay

## 2019-12-23 DIAGNOSIS — R1013 Epigastric pain: Secondary | ICD-10-CM | POA: Insufficient documentation

## 2019-12-23 DIAGNOSIS — R748 Abnormal levels of other serum enzymes: Secondary | ICD-10-CM | POA: Insufficient documentation

## 2021-01-31 IMAGING — US US ABDOMEN COMPLETE
1 series · 14 of 25 positions shown · non-contrast
Comparison: None.

CLINICAL DATA: Epigastric pain, evaluate for gallstones

EXAM:
ABDOMEN ULTRASOUND COMPLETE

[Series 1: us abdomen complete · 14 of 95 slices shown]
[im 1/95]
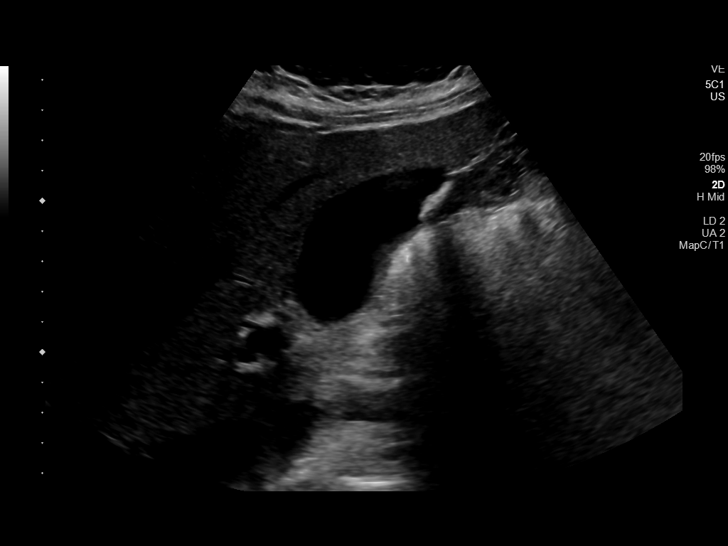
[im 8/95]
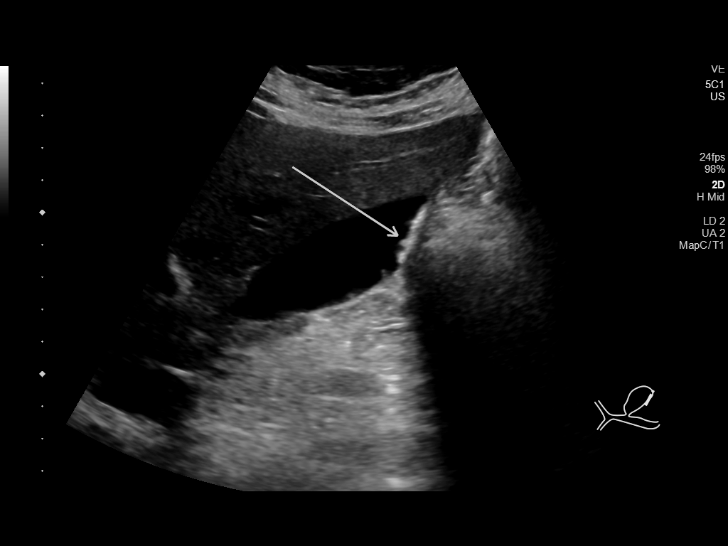
[im 16/95]
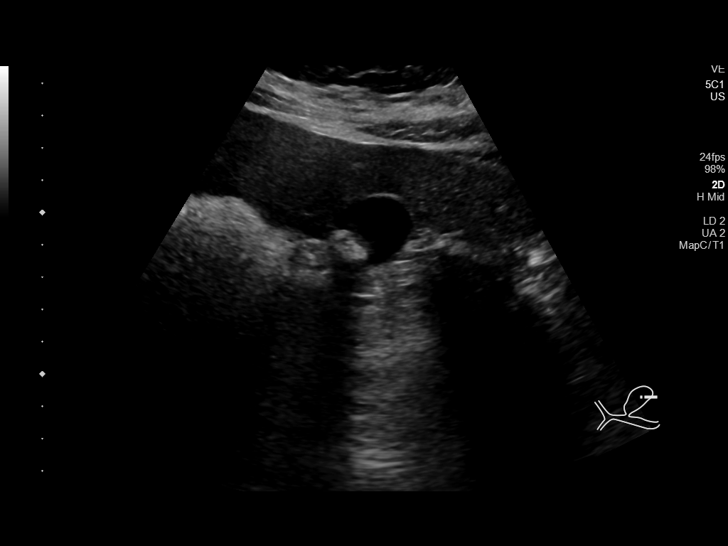
[im 24/95]
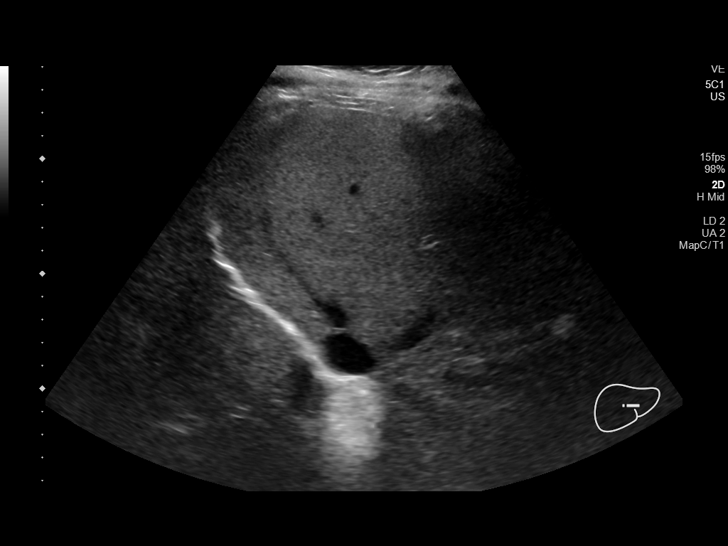
[im 32/95]
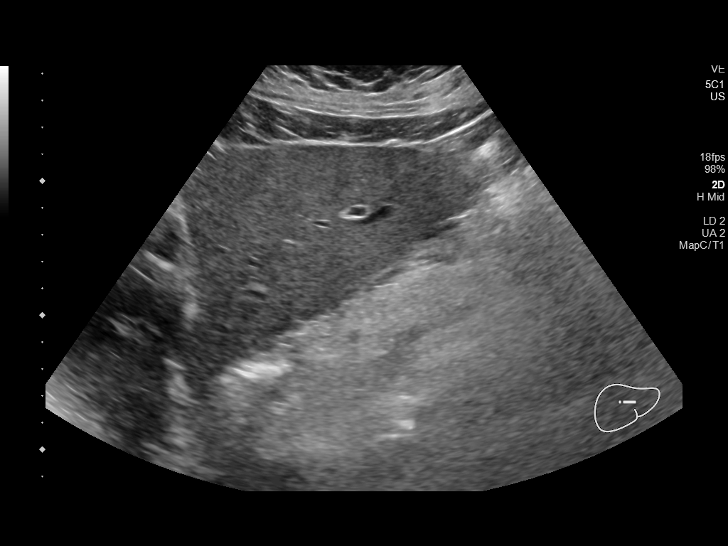
[im 36/95]
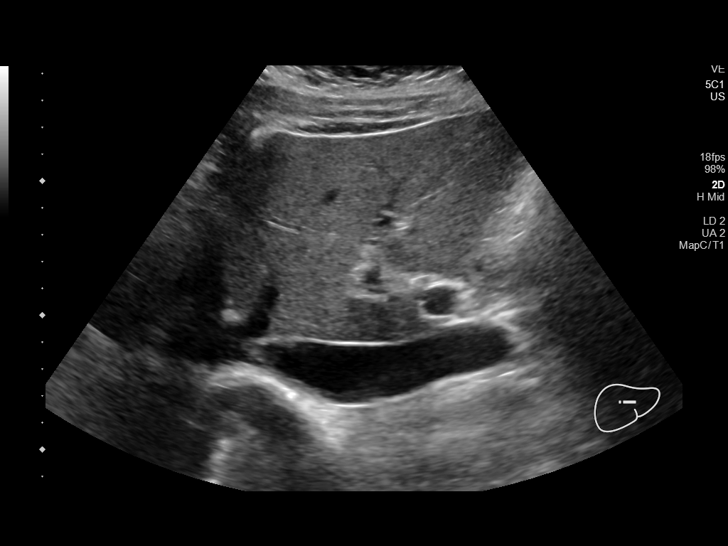
[im 44/95]
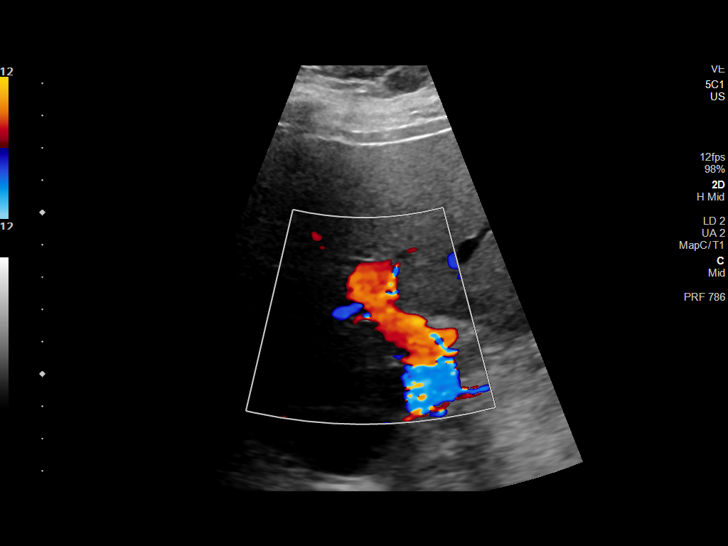
[im 51/95]
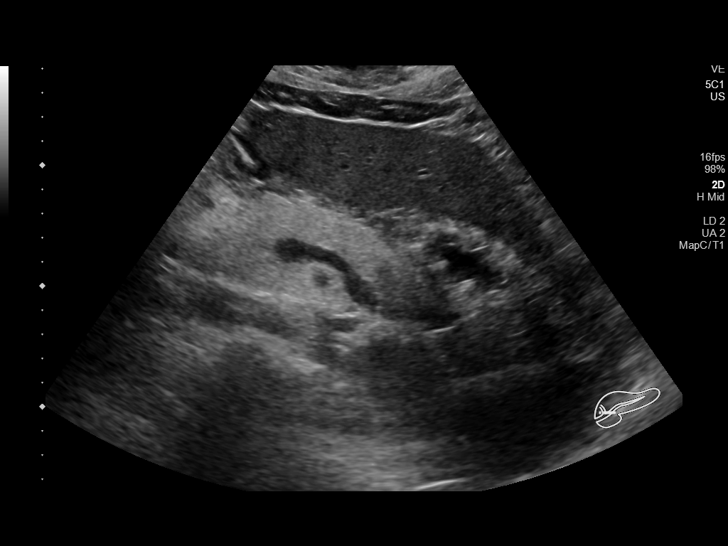
[im 59/95]
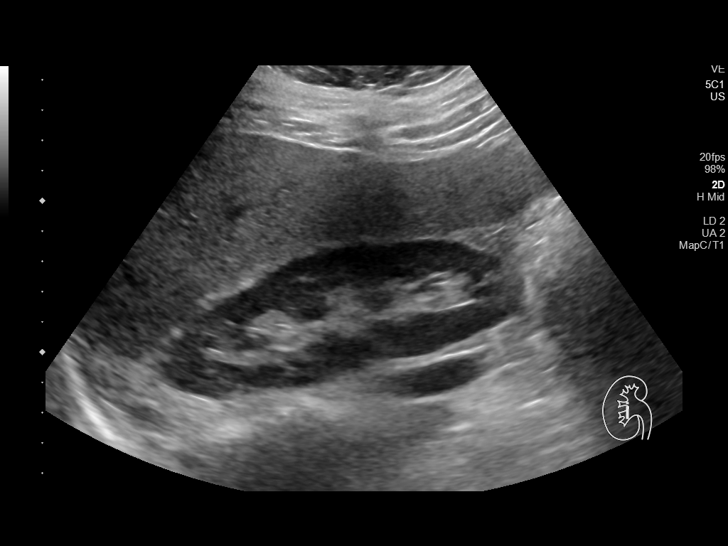
[im 63/95]
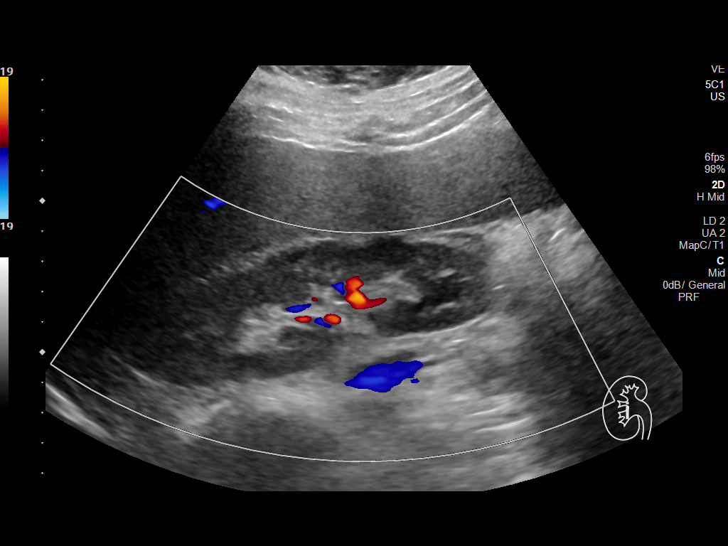
[im 71/95]
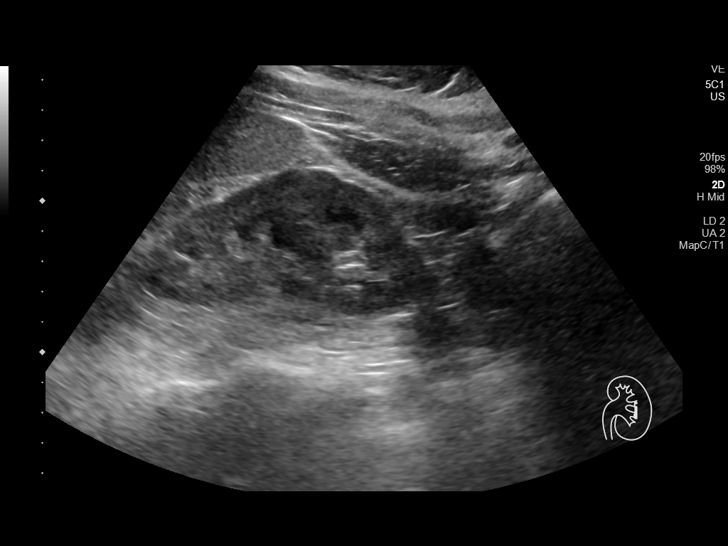
[im 79/95]
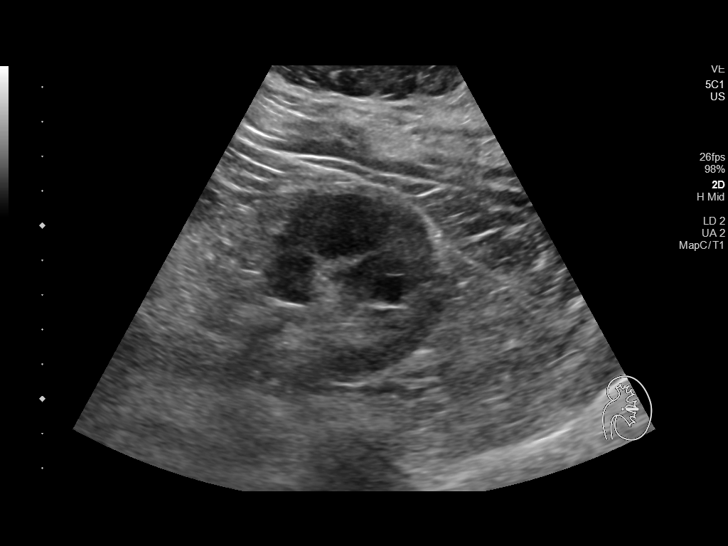
[im 87/95]
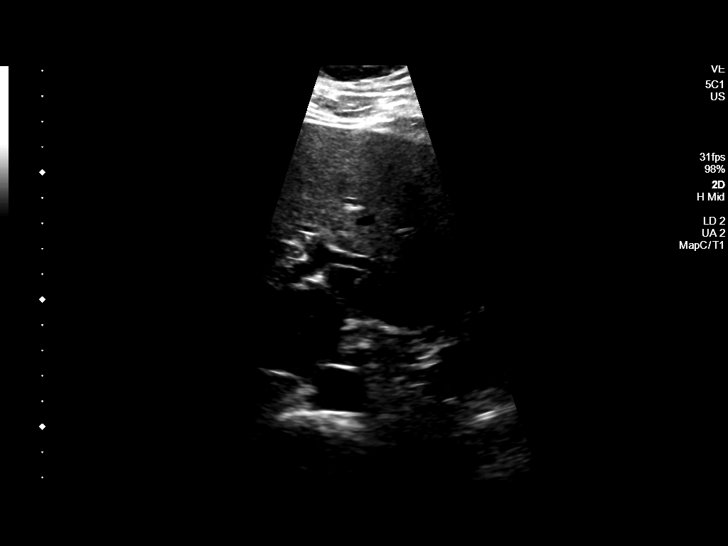
[im 95/95]
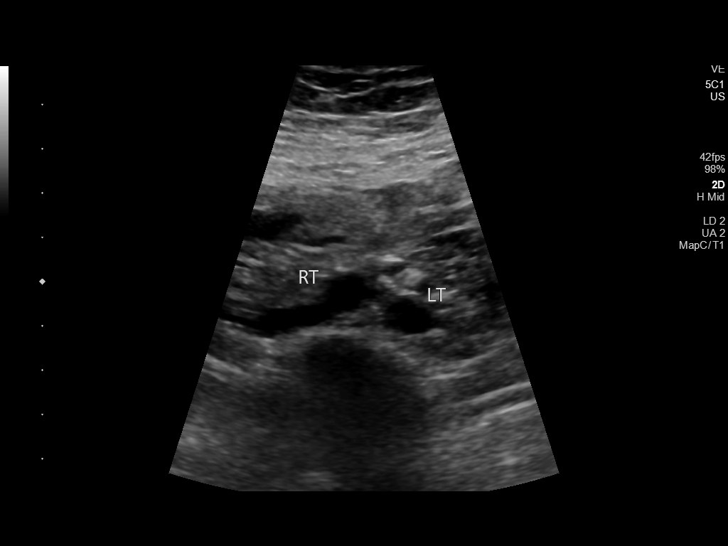

[14 of 25 positions shown; findings below may reference images not displayed]

FINDINGS: Gallbladder: There are multiple small shadowing stones measuring up
to 0.6 cm. No gallbladder wall thickening. Negative sonographic
Murphy sign.

Common bile duct: Diameter: 0.4 cm, within normal limits.

Liver: No focal lesion identified. Slight diffuse increase in liver
parenchymal echogenicity. Portal vein is patent on color Doppler
imaging with normal direction of blood flow towards the liver.

IVC: No abnormality visualized.

Pancreas: Visualized portion unremarkable.

Spleen: Size and appearance within normal limits.

Right Kidney: Length: 12.7 cm. Echogenicity within normal limits. No
mass or hydronephrosis visualized.

Left Kidney: Length: 11.5 cm. Echogenicity within normal limits. No
mass or hydronephrosis visualized.

Abdominal aorta: No aneurysm visualized.

Other findings: None.
IMPRESSION: 1.  Cholelithiasis without evidence cholecystitis.

2. Slight increase in liver parenchymal echogenicity as can be seen
in early fatty infiltration.

## 2023-10-04 LAB — OB RESULTS CONSOLE HEPATITIS B SURFACE ANTIGEN: Hepatitis B Surface Ag: NEGATIVE

## 2023-10-04 LAB — OB RESULTS CONSOLE GC/CHLAMYDIA
Chlamydia: NEGATIVE
Neisseria Gonorrhea: NEGATIVE

## 2023-10-04 LAB — OB RESULTS CONSOLE RUBELLA ANTIBODY, IGM: Rubella: IMMUNE

## 2023-10-04 LAB — OB RESULTS CONSOLE RPR: RPR: NONREACTIVE

## 2023-10-04 LAB — HEPATITIS C ANTIBODY: HCV Ab: NEGATIVE

## 2023-10-04 LAB — OB RESULTS CONSOLE HIV ANTIBODY (ROUTINE TESTING): HIV: NONREACTIVE

## 2023-11-26 DIAGNOSIS — E669 Obesity, unspecified: Secondary | ICD-10-CM | POA: Insufficient documentation

## 2023-11-26 DIAGNOSIS — J45909 Unspecified asthma, uncomplicated: Secondary | ICD-10-CM | POA: Insufficient documentation

## 2023-11-26 DIAGNOSIS — O169 Unspecified maternal hypertension, unspecified trimester: Secondary | ICD-10-CM | POA: Insufficient documentation

## 2023-11-26 NOTE — Progress Notes (Signed)
 Subjective:   Chief Complaint  Patient presents with  . Cough    Dry cough for about a month; 4 months pregnant, coughing so much the passed week that it has caused vomiting; has taken delsym today   . Sore Throat    Starting today; no fevers noted     History of Present Illness Patient with hx of asthma and allergies presents with persistent cough.  Currently 4.5 months pregnant, experiencing persistent cough since 09/2023. Initially attributed to pregnancy, with history of mucus buildup. Over the past month, increased sinus pressure, especially in humid conditions. Symptoms escalated over the past week, requiring more frequent medication use. Completed antibiotics course three weeks ago, temporary relief, symptoms returned five days later, started pseudoephedrine. Past week: sinus pressure, dry cough, predominantly at night, also during the day at school. Believes mucus causing cough. Previously on symbicort and allegra at one time. Recently developed ear pain described as dull pressure, increased congestion, runny nose by end of this past week. 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  Denies fever. Mucus has been clear   Parts of patient history reviewed include PMH, problem list, medications, allergies, and social history.  Objective:   Vitals:   11/26/23 0820  BP: 136/79  BP Location: Left arm  Patient Position: Sitting  Pulse: 91  Resp: 20  Temp: 97.6 F (36.4 C)  TempSrc: Tympanic  SpO2: 100%  Weight: 112 kg (246 lb)  Height: 1.676 m (5' 6)    Physical Exam Vitals reviewed.  Constitutional:      Appearance: Normal appearance.  HENT:     Head: Normocephalic and atraumatic.     Right Ear: Tympanic membrane, ear canal and external ear normal.     Left Ear: Tympanic membrane, ear canal and external ear normal.     Nose: Congestion and rhinorrhea (clear)  present.     Mouth/Throat:     Mouth: Mucous membranes are moist.     Pharynx: No oropharyngeal exudate or posterior oropharyngeal erythema.  Eyes:     Extraocular Movements: Extraocular movements intact.  Cardiovascular:     Rate and Rhythm: Normal rate and regular rhythm.     Pulses: Normal pulses.     Heart sounds: Normal heart sounds.  Pulmonary:     Effort: Pulmonary effort is normal.     Breath sounds: Normal breath sounds. No wheezing.  Musculoskeletal:        General: Normal range of motion.     Cervical back: Normal range of motion and neck supple.  Lymphadenopathy:     Cervical: No cervical adenopathy.  Skin:    General: Skin is warm and dry.  Neurological:     General: No focal deficit present.     Mental Status: She is alert and oriented to person, place, and time.            Assessment/Plan:   Cathi was seen today for cough and sore throat.  Diagnoses and all orders for this visit:  Mild intermittent reactive airway disease with acute exacerbation (CMD)  Subacute cough  Other orders -     mometasone (NASONEX) 50 mcg/actuation spry spray; Administer 2 sprays into each nostril daily. -     budesonide (PULMICORT) 180 mcg/actuation inhaler; Inhale 1 puff in the morning and 1 puff before bedtime.     Assessment & Plan Keyuna Cuthrell is a  28 y.o. female who is 4.5 months pregnant with hx of asthma and allergic rhinitis is here for persistent cough for 3 months.  Lungs CTAB, no wheezing, no rales. O2 sat 100% on RA indicating excellent oxygenation. No evidence to suggest pneumonia nor a bacterial bronchitis. No OM nor strep and mucus is clear. Feel bacterial infection is unlikely at this time, no indication for antibiotics. Suspect underlying allergic rhinitis accompanied by reactive airway exacerbation given patient's hx of prior asthma. Given no active wheezing, do not feel oral steroids are indicated at this time as risks may outweigh benefits with current  pregnancy. Recommend starting pulmicort and nasonex, continue tylenol , sudafed and occasional benadryl  PRN as well as aggressive hydration and rest. Follow up with PCP and/or OB  Patient will return for new or worsening symptoms. I discussed the findings today, diagnosis/differential diagnosis, plan and red flags that require return for reevaluation with PCP, Urgent care or EMERGENCY. Patient was agreeable to outlined plan and questions were answered, felt stable for discharge.    All pertinent previous records reviewed prior to and during visit.      Routine Follow Up with Specialist   Electronically signed by: Jon Earnie Flank, NP 11/26/2023 9:17 AM

## 2023-11-27 ENCOUNTER — Encounter (HOSPITAL_COMMUNITY): Payer: Self-pay | Admitting: Obstetrics and Gynecology

## 2023-11-27 ENCOUNTER — Inpatient Hospital Stay (HOSPITAL_COMMUNITY)
Admission: AD | Admit: 2023-11-27 | Discharge: 2023-11-27 | Disposition: A | Discharge status: Home / Self Care | Attending: Obstetrics and Gynecology | Admitting: Obstetrics and Gynecology

## 2023-11-27 ENCOUNTER — Inpatient Hospital Stay (HOSPITAL_COMMUNITY)

## 2023-11-27 DIAGNOSIS — Z3A16 16 weeks gestation of pregnancy: Secondary | ICD-10-CM

## 2023-11-27 DIAGNOSIS — O26892 Other specified pregnancy related conditions, second trimester: Secondary | ICD-10-CM | POA: Diagnosis not present

## 2023-11-27 DIAGNOSIS — O99512 Diseases of the respiratory system complicating pregnancy, second trimester: Secondary | ICD-10-CM | POA: Insufficient documentation

## 2023-11-27 DIAGNOSIS — O10012 Pre-existing essential hypertension complicating pregnancy, second trimester: Secondary | ICD-10-CM | POA: Diagnosis not present

## 2023-11-27 DIAGNOSIS — R053 Chronic cough: Secondary | ICD-10-CM

## 2023-11-27 DIAGNOSIS — R0602 Shortness of breath: Secondary | ICD-10-CM | POA: Diagnosis present

## 2023-11-27 DIAGNOSIS — R09A2 Foreign body sensation, throat: Secondary | ICD-10-CM | POA: Diagnosis not present

## 2023-11-27 DIAGNOSIS — Z6741 Type O blood, Rh negative: Secondary | ICD-10-CM | POA: Insufficient documentation

## 2023-11-27 NOTE — MAU Note (Signed)
 Brooke Oconnor is a 28 y.o. at Unknown here in MAU reporting: sine June has had an off and on cough. Doesn't have an official diagnosis of asthma.  Since she had her last daughter, she has had more breathing problems. Mostly been a dry cough, will keep going until she throws up, since June, that had been happening almost wks. Over the weekend, she started having more of that and seeing more mucous.  (No actual food emesis).  Her nose has now started running also. No fever.  Fiance has been having sinus  issues and has an ear inf. Daughter had a runny nose about 10 days  ago.  No one has had a fever, or cough.  Went to UC yesterday. They did not do swabs.  She did an at home covid test- it was neg.  Chest is sore, when has prolonged  coughing episodes- she gets pain in lower abd.  Has sore throat.  Ears hurt, sinus pressure/pain across bridge of nose.  Onset of complaint: June Pain score: see doc Vitals:   11/27/23 1755  SpO2: 100%     FHT:158 Lab orders placed from triage:     When she has the prolonged episodes, she has tried her inhaler, it has not worked, feels like her airway is very constricted.

## 2023-11-27 NOTE — MAU Provider Note (Signed)
 History     [id number]  Arrival date and time: 11/27/23 1657    Chief Complaint  Patient presents with   Cough   Nasal Congestion     HPI Brooke Oconnor is a 28 y.o. at [redacted]w[redacted]d with PMHx notable for asthma, chronic hypertension, Rh neg, who presents for chronic cough.   Review of records from Care Everywhere: recent TB test negative on 09/15/2023.   Patient reports consistent cough for the past two months In July told her doctors that she had been struggling with cough It has been bothering her to the point of disturbing her sleep Was rx'd amoxicillin, was good for a few days but then symptoms returned Since then has continued to have issues with coughing and increased mucous Takes zyrtec daily. Has also trialed occasional sudafed. 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 Has also trialed inhaler but doesn't seem to help Last night started to have a runny nose and lots of mucous Still having a lot of sinus pressure Patient reports she had whooping cough when she was 28 years old Has never received tdap to her knowledge As a child she had to use a nebulizer but need faded as she got older Asthma and allergies problems flared back up after birth of her last child Has not tried flonase but was recently prescribed it at urgent care  Also reports feeling a globus sensation when swallowing since June as well Only with swallowing which she can do without issue for solids and liquids, not at other times  Separately worried about health of baby with taking sudafed, tylenol , other OTC meds No vaginal bleeding or leaking fluid       OB History     Gravida  2   Para  0   Term      Preterm      AB      Living  1      SAB      IAB      Ectopic      Multiple      Live Births              Past Medical History:  Diagnosis Date   Asthma    Elevated alkaline phosphatase level     Elevated LFTs    Hypertension    Pregnancy induced hypertension     Past Surgical History:  Procedure Laterality Date   WISDOM TOOTH EXTRACTION      Family History  Problem Relation Age of Onset   Diabetes Mother    Hypertension Mother    Thyroid disease Mother    Hypertension Father    Diabetes Maternal Aunt    Thyroid disease Maternal Aunt    Diabetes Maternal Grandmother    Hypertension Maternal Grandmother    Thyroid disease Maternal Grandmother    Heart attack Maternal Grandmother    Heart disease Paternal Grandmother    Hypertension Paternal Grandmother    Heart disease Paternal Grandfather    Hypertension Paternal Grandfather    Diabetes Brother    Lung cancer Maternal Grandfather    Heart attack Maternal Grandfather     Social History   Socioeconomic History   Marital status: Unknown    Spouse name: Not on file   Number of children: Not on file   Years of education: Not on file   Highest education level: Not on file  Occupational History   Not on file  Tobacco Use   Smoking status: Never   Smokeless tobacco: Never  Vaping Use   Vaping status: Never Used  Substance and Sexual Activity   Alcohol use: Not Currently   Drug use: Never   Sexual activity: Not on file  Other Topics Concern   Not on file  Social History Narrative   Not on file   Social Drivers of Health   Financial Resource Strain: Not on file  Food Insecurity: Low Risk  (11/26/2023)   Received from [facility]   Hunger Vital Sign    Within the past 12 months, you worried that your food would run out before you got money to buy more: Never true    Within the past 12 months, the food you bought just didn't last and you didn't have money to get more. : Never true  Transportation Needs: No Transportation Needs (11/26/2023)   Received from [facility]    In the past 12 months, has lack of reliable transportation kept you from medical appointments, meetings, work or from  getting things needed for daily living? : No  Physical Activity: Not on file  Stress: Not on file  Social Connections: Unknown (08/17/2021)   Received from [facility]   Social Network    Social Network: Not on file  Intimate Partner Violence: Unknown (07/09/2021)   Received from [facility]   HITS    Physically Hurt: Not on file    Insult or Talk Down To: Not on file    Threaten Physical Harm: Not on file    Scream or Curse: Not on file    Allergies  Allergen Reactions   Pertussis Vaccines Other (See Comments)    Both Brothers get seizures  Both Brothers get seizures  Both Brothers get seizures  Both Brothers get seizures, Both Brothers get seizures    No current facility-administered medications on file prior to encounter.   Current Outpatient Medications on File Prior to Encounter  Medication Sig Dispense Refill   albuterol (VENTOLIN HFA) 108 (90 Base) MCG/ACT inhaler Inhale 2 puffs into the lungs every 4 (four) hours as needed.     aspirin EC 81 MG tablet Take 81 mg by mouth daily. Swallow whole.     budesonide (PULMICORT FLEXHALER) 180 MCG/ACT inhaler Inhale 1 puff into the lungs.     cetirizine (ZYRTEC) 10 MG tablet Take 10 mg by mouth daily.     mometasone (NASONEX) 50 MCG/ACT nasal spray Place 2 sprays into the nose daily.     Prenatal Vit-Fe Fumarate-FA (MULTIVITAMIN-PRENATAL) 27-0.8 MG TABS tablet Take 1 tablet by mouth daily at 12 noon.     fexofenadine (ALLEGRA) 180 MG tablet Take 180 mg by mouth daily.       ROS Pertinent positives and negative per HPI, all others reviewed and negative  Physical Exam   BP 138/68   Pulse 92   Temp 98.4 F (36.9 C) (Oral)   Resp 20   Ht 5' 6 (1.676 m)   Wt 111.3 kg   SpO2 99%   BMI 39.59 kg/m   Patient Vitals for the past 24 hrs:  BP Temp Temp src Pulse Resp SpO2 Height Weight  11/27/23 2100 -- -- -- -- -- 99 % -- --  11/27/23 2030 -- -- -- -- -- 99 % -- --  11/27/23 2000 -- -- -- -- -- 99 % -- --  11/27/23  1945 -- -- -- -- --  100 % -- --  11/27/23 1944 138/68 -- -- 92 -- -- -- --  11/27/23 1819 121/66 -- -- 97 -- -- -- --  11/27/23 1815 -- -- -- -- -- 99 % -- --  11/27/23 1801 (!) 151/80 98.4 F (36.9 C) Oral 96 20 100 % 5' 6 (1.676 m) 111.3 kg  11/27/23 1755 -- -- -- -- -- 100 % -- --    Physical Exam Vitals reviewed.  Constitutional:      General: She is not in acute distress.    Appearance: She is well-developed. She is not diaphoretic.  Eyes:     General: No scleral icterus. Neck:     Thyroid: No thyromegaly.  Pulmonary:     Effort: Pulmonary effort is normal. No respiratory distress.     Breath sounds: Normal breath sounds. No stridor. No wheezing, rhonchi or rales.  Abdominal:     General: There is no distension.     Palpations: Abdomen is soft.     Tenderness: There is no abdominal tenderness. There is no guarding or rebound.  Lymphadenopathy:     Cervical: No cervical adenopathy.  Skin:    General: Skin is warm and dry.  Neurological:     Mental Status: She is alert.     Coordination: Coordination normal.      Cervical Exam    Bedside Ultrasound Not performed.  My interpretation: n/a  FHT 158 bpm by doppler  Labs No results found for this or any previous visit (from the past 24 hours).  Imaging DG Chest 2 View Result Date: 11/27/2023 CLINICAL DATA:  Shortness of breath and cough EXAM: CHEST - 2 VIEW COMPARISON:  Linear opacity FINDINGS: The heart size and mediastinal contours are within normal limits. Linear opacity posterior to the heart in lateral view likely vascular shadow or subsegmental atelectasis. No pleural effusion. The visualized skeletal structures are unremarkable. IMPRESSION: Linear opacity in retrocardiac in lateral view likely vascular shadow or subsegmental atelectasis. Electronically Signed   By: Megan  Zare M.D.   On: 11/27/2023 19:54    MAU Course  Procedures Lab Orders         Bordetella pertussis PCR     No orders of the defined  types were placed in this encounter.  Imaging Orders         DG Chest 2 View     MDM Moderate (Level 3-4)  Assessment and Plan  #Chronic Cough #[redacted] weeks gestation of pregnancy Unclear etiology. CXR without signs of infection and also with negative TB testing recently. Whooping cough is a possibility and does work in a school as a Runner, broadcasting/film/video, PCR collected and sent. Rhinorrhea is also possible and has only taken oral allergy meds, recommended trial of flonase for 1-2 weeks, if no improvement then trial PPI. Patient in agreement with plan.   #Globus sensation Discussed this has a long differential, but will need GI to workup. Referral placed with discharge orders.   #FWB Reassured meds are not associated with congenital anomalies and considered safe in pregnancy. Normal FHR.    Dispo: discharged to home in stable condition   Donnice CHRISTELLA Carolus, MD/MPH 11/27/23 9:09 PM  Allergies as of 11/27/2023       Reactions   Pertussis Vaccines Other (See Comments)   Both Brothers get seizures  Both Brothers get seizures Both Brothers get seizures Both Brothers get seizures, Both Brothers get seizures        Medication List     TAKE these  medications    albuterol 108 (90 Base) MCG/ACT inhaler Commonly known as: VENTOLIN HFA Inhale 2 puffs into the lungs every 4 (four) hours as needed.   aspirin EC 81 MG tablet Take 81 mg by mouth daily. Swallow whole.   cetirizine 10 MG tablet Commonly known as: ZYRTEC Take 10 mg by mouth daily.   fexofenadine 180 MG tablet Commonly known as: ALLEGRA Take 180 mg by mouth daily.   mometasone 50 MCG/ACT nasal spray Commonly known as: NASONEX Place 2 sprays into the nose daily.   multivitamin-prenatal 27-0.8 MG Tabs tablet Take 1 tablet by mouth daily at 12 noon.   Pulmicort Flexhaler 180 MCG/ACT inhaler Generic drug: budesonide Inhale 1 puff into the lungs.

## 2023-11-30 ENCOUNTER — Ambulatory Visit: Payer: Self-pay | Admitting: Family Medicine

## 2023-11-30 ENCOUNTER — Encounter: Payer: Self-pay | Admitting: Lactation Services

## 2023-11-30 LAB — BORDETELLA PERTUSSIS PCR
B parapertussis, DNA: NEGATIVE
B pertussis, DNA: NEGATIVE

## 2024-04-11 ENCOUNTER — Encounter: Payer: Self-pay | Admitting: Internal Medicine

## 2024-04-11 ENCOUNTER — Ambulatory Visit (INDEPENDENT_AMBULATORY_CARE_PROVIDER_SITE_OTHER): Admitting: Internal Medicine

## 2024-04-11 VITALS — BP 118/58 | HR 87 | Ht 66.0 in | Wt 255.1 lb

## 2024-04-11 DIAGNOSIS — R09A2 Foreign body sensation, throat: Secondary | ICD-10-CM

## 2024-04-11 DIAGNOSIS — K760 Fatty (change of) liver, not elsewhere classified: Secondary | ICD-10-CM | POA: Diagnosis not present

## 2024-04-11 DIAGNOSIS — K219 Gastro-esophageal reflux disease without esophagitis: Secondary | ICD-10-CM | POA: Diagnosis not present

## 2024-04-11 DIAGNOSIS — R7989 Other specified abnormal findings of blood chemistry: Secondary | ICD-10-CM | POA: Diagnosis not present

## 2024-04-11 DIAGNOSIS — Z3A37 37 weeks gestation of pregnancy: Secondary | ICD-10-CM

## 2024-04-11 DIAGNOSIS — E669 Obesity, unspecified: Secondary | ICD-10-CM

## 2024-04-11 LAB — OB RESULTS CONSOLE GBS: GBS: NEGATIVE

## 2024-04-11 NOTE — Patient Instructions (Signed)
 Please follow up with Dr. Albertus in 4 months. We do not have that schedule available. We should have it available in February or March. Please contact our office to schedule.   GERD in Adults: Diet Changes When you have gastroesophageal reflux disease (GERD), you may need to make changes to your diet. Choosing the right foods can help with your symptoms. Think about working with an expert in healthy eating called a dietitian. They can help you make healthy food choices. What are tips for following this plan? Reading food labels Look for foods that are low in saturated fat. Foods that may help with your symptoms include: Foods with less than 5% of daily value (DV) of fat. Foods with 0 grams of trans fat. Cooking [employer] in ways that don't use a lot of fat. These ways include: Baking. Steaming. Grilling. Broiling. To add flavor, try to use herbs that are low in spice and acidity. Avoid frying your food. Meal planning  Eat small meals often rather than eating 3 large meals each day. Eat your meals slowly in a place where you feel relaxed. If told by your health care provider, avoid: Foods that cause symptoms. Keep a food diary to keep track of foods that cause symptoms. Alcohol. Drinking a lot of liquid with meals. General instructions For 2-3 hours after you eat, avoid: Bending over. Exercise. Lying down. Chew sugar-free gum after meals. What foods should I eat? Eat a healthy diet. Try to include: Foods with high amounts of fiber. These include: Fruits and vegetables. Whole grains and beans. Low-fat dairy products. Lean meats, fish, and poultry. Egg whites. Foods that cause symptoms in someone else may not cause symptoms for you. Work with your provider to find foods that are safe for you. The items listed above may not be all the foods and drinks you can have. Talk with a dietitian to learn more. The items listed above may not be a complete list of foods and beverages  you can eat and drink. Contact a dietitian for more information. What foods should I avoid? Limiting some of these foods may help with your symptoms. Each person is different. Talk with a dietitian or your provider to help you find the exact foods to avoid. Some of the foods to avoid may include: Fruits Fruits with a lot of acid in them. These may include citrus fruits, such as oranges, grapefruit, pineapple, and lemons. Vegetables Deep-fried vegetables, such as French fries. Vegetables, sauces, or toppings made with added fat and vegetables with acid in them. These may include tomatoes and tomato products, chili peppers, onions, garlic, and horseradish. Grains Pastries or quick breads with added fat. Meats and other proteins High-fat meats, such as fatty beef or pork, hot dogs, ribs, ham, sausage, salami, and bacon. Fried meat or protein, such as fried fish and fried chicken. Egg yolks. Fats and oils Butter. Margarine. Shortening. Ghee. Drinks Coffee and other drinks with caffeine in them. Fizzy and sugary drinks, such as soda and energy drinks. Fruit juice made with acidic fruits, such as orange or grapefruit. Tomato juice. Sweets and desserts Chocolate and cocoa. Donuts. Seasonings and condiments Mint, such as peppermint and spearmint. Condiments, herbs, or seasonings that cause symptoms. These may include curry, hot sauce, or vinegar-based salad dressings. The items listed above may not be all the foods and drinks you should avoid. Talk with a dietitian to learn more. Questions to ask your health care provider Changes to your diet and everyday life  are often the first steps taken to manage symptoms of GERD. If these changes don't help, talk with your provider about taking medicines. Where to find more information [employer]: aboutgerd.org This information is not intended to replace advice given to you by your health care provider. Make sure  you discuss any questions you have with your health care provider. Document Revised: 01/31/2023 Document Reviewed: 08/17/2022 Elsevier Patient Education  2024 Elsevier Inc.  _______________________________________________________  If your blood pressure at your visit was 140/90 or greater, please contact your primary care physician to follow up on this.  _______________________________________________________  If you are age 51 or older, your body mass index should be between 23-30. Your Body mass index is 41.18 kg/m. If this is out of the aforementioned range listed, please consider follow up with your Primary Care Provider.  If you are age 33 or younger, your body mass index should be between 19-25. Your Body mass index is 41.18 kg/m. If this is out of the aformentioned range listed, please consider follow up with your Primary Care Provider.   ________________________________________________________  The [facility] providers would like to encourage you to use MYCHART to communicate with providers for non-urgent requests or questions.  Due to long hold times on the telephone, sending your provider a message by [facility] may be a faster and more efficient way to get a response.  Please allow 48 business hours for a response.  Please remember that this is for non-urgent requests.  _______________________________________________________  [facility] is using a team-based approach to care.  Your team is made up of your doctor and two to three APPS. Our APPS (Nurse Practitioners and Physician Assistants) work with your physician to ensure care continuity for you. They are fully qualified to address your health concerns and develop a treatment plan. They communicate directly with your gastroenterologist to care for you. Seeing the Advanced Practice Practitioners on your physician's team can help you by facilitating care more promptly, often allowing for earlier appointments, access to  diagnostic testing, procedures, and other specialty referrals.

## 2024-04-11 NOTE — Progress Notes (Signed)
 Patient ID: Brooke Oconnor, female   DOB: 12-May-1995, 29 y.o.   MRN: [id number] HPI:  Brooke Oconnor is a 29 year old female with cholelithiasis, history of fatty liver, and obesity who presents for evaluation of chronic cough versus regurgitation with mucus production and globus sensation.  Recurrent episodes of persistent cough with clear mucus production have occurred monthly during her current pregnancy, approximately every 1.5 weeks. Cough is often triggered by lying down and is described as a sensation like somebody just pinched the airway for a second or like I breathed in a little bit of dust, followed by prolonged coughing until expectoration of mucus. Relief is achieved after expectorating, though coughing causes physical discomfort. Occasionally, blowing her nose stops the cough, but at other times, mucus seems stuck between her nose and mouth, leading to transient difficulty breathing until cleared.  She has a difficult time knowing if she is truly coughing or regurgitating/vomiting this up from her esophagus.  No classic heartburn, burning chest pain, or consistent regurgitation. Describes a globus sensation, with labored but not painful swallowing and no food sticking or need for extra effort to swallow solids. During an episode in August 2025, she was unable to keep food down after meals due to coughing and mucus expectoration, with symptoms occurring within an hour of eating. Nasonex nasal spray provided significant improvement within a few days. Chest x-rays and blood oxygen levels have been normal during these episodes.  History of elevated liver enzymes and episodes of epigastric pain following her first pregnancy in July 2021. Abdominal ultrasound at that time showed cholelithiasis and mild increased liver echogenicity. Liver enzymes normalized except for a mildly elevated alkaline phosphatase. No further episodes of epigastric pain or ulcerative attacks since discontinuing pain  medications after 2021.  Hypertension occurred during her first pregnancy and a single episode of elevated blood pressure during her current pregnancy, which resolved without ongoing antihypertensive therapy. She developed allergies after her first pregnancy, currently managed with Claritin and Nasonex. Uses Mucinex and Tums for symptom relief, with Tums providing some short-term benefit. Current medications also include a Pulmicort inhaler (not in use due to insurance), and as-needed albuterol inhaler.  Currently [redacted] weeks pregnant, with a pre-pregnancy weight of 238 pounds and less than 20 pounds gained during this pregnancy. History of elevated cholesterol, now controlled. Monitors blood sugar due to failed glucose tolerance tests, but values have remained normal. She and her family have made dietary and lifestyle changes, including meal timing and avoiding lying down after eating, which she feels have been beneficial.      Past Medical History:  Diagnosis Date   Asthma    Elevated alkaline phosphatase level    Elevated LFTs    Gallstones    Hypertension    Pregnancy induced hypertension     Past Surgical History:  Procedure Laterality Date   WISDOM TOOTH EXTRACTION      Outpatient Medications Prior to Visit  Medication Sig Dispense Refill   albuterol (VENTOLIN HFA) 108 (90 Base) MCG/ACT inhaler Inhale 2 puffs into the lungs every 4 (four) hours as needed.     budesonide (PULMICORT FLEXHALER) 180 MCG/ACT inhaler Inhale 1 puff into the lungs.     cetirizine (ZYRTEC) 10 MG tablet Take 10 mg by mouth daily.     Prenatal Vit-Fe Fumarate-FA (MULTIVITAMIN-PRENATAL) 27-0.8 MG TABS tablet Take 1 tablet by mouth daily at 12 noon.     aspirin EC 81 MG tablet Take 81 mg by mouth daily. Swallow whole. (Patient  not taking: Reported on 04/11/2024)     fexofenadine (ALLEGRA) 180 MG tablet Take 180 mg by mouth daily. (Patient not taking: Reported on 04/11/2024)     mometasone (NASONEX) 50 MCG/ACT nasal  spray Place 2 sprays into the nose daily. (Patient not taking: Reported on 04/11/2024)     No facility-administered medications prior to visit.    Allergies[1]  Family History  Problem Relation Age of Onset   Diabetes Mother    Hypertension Mother    Thyroid disease Mother    Hypertension Father    Diabetes Maternal Aunt    Thyroid disease Maternal Aunt    Diabetes Maternal Grandmother    Hypertension Maternal Grandmother    Thyroid disease Maternal Grandmother    Heart attack Maternal Grandmother    Heart disease Paternal Grandmother    Hypertension Paternal Grandmother    Heart disease Paternal Grandfather    Hypertension Paternal Grandfather    Diabetes Brother    Lung cancer Maternal Grandfather    Heart attack Maternal Grandfather     Social History[2]  ROS: As per history of present illness, otherwise negative  BP (!) 118/58   Pulse 87   Ht 5' 6 (1.676 m)   Wt 255 lb 2 oz (115.7 kg) Comment: pregnant  BMI 41.18 kg/m  Gen: awake, alert, NAD HEENT: anicteric  Neuro: nonfocal   RELEVANT LABS AND IMAGING:  Alk Phos (12/17/2019): 162, slightly elevated Total bili (12/17/2019): 0.3 AST (12/17/2019): 18 ALT (12/17/2019): 27 Hepatitis A immunity (12/2019): Not immune Hepatitis B immunity (12/2019): Not immune ANA (12/2019): Positive at 1:40 IgG (12/2019): Within normal limits Anti-smooth muscle antibody (12/2019): Negative Anti-mitochondrial antibody (12/2019): Negative Ferritin (12/2019): Within normal limits Alpha-1 antitrypsin (12/2019): Within normal limits Pertussis PCR (11/2023): Negative  Radiology Abdominal ultrasound (12/2019): Cholelithiasis without cholecystitis, mild increased hepatic parenchymal echogenicity suggestive of early steatosis, common bile duct 0.4 mm      ASSESSMENT/PLAN:  Possible gastroesophageal reflux disease with esophageal symptoms/globus sensation Chronic esophageal symptoms likely multifactorial, exacerbated by  pregnancy. Differential includes atypical GERD, eosinophilic esophagitis, and other allergic or inflammatory causes. Managed conservatively with lifestyle and allergy interventions, further evaluation planned postpartum. - Educated on reflux triggers: caffeine, meal timing, size, avoiding recumbency post-eating/drinking. - Discussed lifestyle modifications: head elevation, left lateral decubitus sleep position. - Recommended continuation of allergy management and nasal spray. - Discussed postpartum PPI trial, safe during breastfeeding. - Planned EGD post-delivery for reflux/allergic evaluation. - Advised contact via MyChart/phone postpartum if symptoms worsen, option for early PPI trial. - Scheduled follow-up four months postpartum for symptom reassessment and diagnostics.  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 Evaluation deferred during pregnancy, focus on metabolic risk reduction. - Planned repeat liver enzyme testing and possible FibroScan postp will artum. - Discussed long-term management: weight management, Mediterranean diet, regular exercise. - Discussed potential future GLP-1 agonists for weight and liver management. - Scheduled follow-up four months postpartum for liver status reassessment and management discussion.          [1]  Allergies Allergen Reactions   Pertussis Vaccines Other (See Comments)    Both Brothers get seizures  Both Brothers get seizures  Both Brothers get seizures  Both Brothers get seizures, Both Brothers get seizures  [2]  Social History Tobacco Use   Smoking status: Never   Smokeless tobacco: Never  Vaping Use   Vaping status: Never Used  Substance Use Topics   Alcohol use: Not  Currently   Drug use: Never

## 2024-04-19 ENCOUNTER — Encounter (HOSPITAL_COMMUNITY): Payer: Self-pay | Admitting: *Deleted

## 2024-04-19 ENCOUNTER — Telehealth (HOSPITAL_COMMUNITY): Payer: Self-pay | Admitting: *Deleted

## 2024-04-19 NOTE — Telephone Encounter (Signed)
 Preadmission screen

## 2024-04-25 ENCOUNTER — Telehealth (HOSPITAL_COMMUNITY): Payer: Self-pay | Admitting: *Deleted

## 2024-04-25 ENCOUNTER — Encounter (HOSPITAL_COMMUNITY): Payer: Self-pay | Admitting: *Deleted

## 2024-04-25 NOTE — Telephone Encounter (Signed)
 Preadmission screen

## 2024-04-30 ENCOUNTER — Other Ambulatory Visit: Payer: Self-pay | Admitting: Student

## 2024-04-30 DIAGNOSIS — O99213 Obesity complicating pregnancy, third trimester: Secondary | ICD-10-CM

## 2024-04-30 DIAGNOSIS — J45909 Unspecified asthma, uncomplicated: Secondary | ICD-10-CM

## 2024-04-30 DIAGNOSIS — O24410 Gestational diabetes mellitus in pregnancy, diet controlled: Secondary | ICD-10-CM

## 2024-04-30 DIAGNOSIS — O24419 Gestational diabetes mellitus in pregnancy, unspecified control: Secondary | ICD-10-CM | POA: Insufficient documentation

## 2024-05-01 ENCOUNTER — Inpatient Hospital Stay (HOSPITAL_COMMUNITY): Admitting: Anesthesiology

## 2024-05-01 ENCOUNTER — Inpatient Hospital Stay (HOSPITAL_COMMUNITY)
Admission: RE | Admit: 2024-05-01 | Discharge: 2024-05-04 | DRG: 787 | Disposition: A | Discharge status: Home / Self Care | Attending: Student | Admitting: Student

## 2024-05-01 ENCOUNTER — Encounter (HOSPITAL_COMMUNITY): Payer: Self-pay | Admitting: Student

## 2024-05-01 ENCOUNTER — Inpatient Hospital Stay (HOSPITAL_COMMUNITY)

## 2024-05-01 ENCOUNTER — Other Ambulatory Visit: Payer: Self-pay

## 2024-05-01 DIAGNOSIS — E66813 Obesity, class 3: Secondary | ICD-10-CM | POA: Diagnosis present

## 2024-05-01 DIAGNOSIS — O99214 Obesity complicating childbirth: Secondary | ICD-10-CM | POA: Diagnosis present

## 2024-05-01 DIAGNOSIS — O1092 Unspecified pre-existing hypertension complicating childbirth: Secondary | ICD-10-CM | POA: Diagnosis present

## 2024-05-01 DIAGNOSIS — Z833 Family history of diabetes mellitus: Secondary | ICD-10-CM

## 2024-05-01 DIAGNOSIS — Z6791 Unspecified blood type, Rh negative: Secondary | ICD-10-CM

## 2024-05-01 DIAGNOSIS — O9962 Diseases of the digestive system complicating childbirth: Secondary | ICD-10-CM | POA: Diagnosis present

## 2024-05-01 DIAGNOSIS — O26893 Other specified pregnancy related conditions, third trimester: Secondary | ICD-10-CM | POA: Diagnosis present

## 2024-05-01 DIAGNOSIS — O9902 Anemia complicating childbirth: Secondary | ICD-10-CM | POA: Diagnosis present

## 2024-05-01 DIAGNOSIS — Z3A39 39 weeks gestation of pregnancy: Secondary | ICD-10-CM

## 2024-05-01 DIAGNOSIS — O24420 Gestational diabetes mellitus in childbirth, diet controlled: Principal | ICD-10-CM | POA: Diagnosis present

## 2024-05-01 DIAGNOSIS — O24419 Gestational diabetes mellitus in pregnancy, unspecified control: Principal | ICD-10-CM | POA: Diagnosis present

## 2024-05-01 DIAGNOSIS — O24410 Gestational diabetes mellitus in pregnancy, diet controlled: Secondary | ICD-10-CM

## 2024-05-01 DIAGNOSIS — Z8249 Family history of ischemic heart disease and other diseases of the circulatory system: Secondary | ICD-10-CM

## 2024-05-01 DIAGNOSIS — K219 Gastro-esophageal reflux disease without esophagitis: Secondary | ICD-10-CM | POA: Diagnosis present

## 2024-05-01 LAB — PROTEIN / CREATININE RATIO, URINE
Creatinine, Urine: 29 mg/dL
Total Protein, Urine: <6 mg/dL

## 2024-05-01 LAB — GLUCOSE, CAPILLARY
Glucose-Capillary: 103 mg/dL — ABNORMAL HIGH (ref 70–99)
Glucose-Capillary: 131 mg/dL — ABNORMAL HIGH (ref 70–99)
Glucose-Capillary: 68 mg/dL — ABNORMAL LOW (ref 70–99)
Glucose-Capillary: 77 mg/dL (ref 70–99)
Glucose-Capillary: 80 mg/dL (ref 70–99)

## 2024-05-01 LAB — COMPREHENSIVE METABOLIC PANEL WITH GFR
ALT: 9 U/L (ref 0–44)
AST: 20 U/L (ref 15–41)
Albumin: 3.7 g/dL (ref 3.5–5.0)
Alkaline Phosphatase: 172 U/L — ABNORMAL HIGH (ref 38–126)
Anion gap: 12 (ref 5–15)
BUN: 8 mg/dL (ref 6–20)
CO2: 20 mmol/L — ABNORMAL LOW (ref 22–32)
Calcium: 9.3 mg/dL (ref 8.9–10.3)
Chloride: 104 mmol/L (ref 98–111)
Creatinine, Ser: 0.47 mg/dL (ref 0.44–1.00)
GFR, Estimated: >60 mL/min
Glucose, Bld: 77 mg/dL (ref 70–99)
Potassium: 3.6 mmol/L (ref 3.5–5.1)
Sodium: 136 mmol/L (ref 135–145)
Total Bilirubin: 0.2 mg/dL (ref 0.0–1.2)
Total Protein: 6.7 g/dL (ref 6.5–8.1)

## 2024-05-01 LAB — TYPE AND SCREEN
ABO/RH(D): O NEG
Antibody Screen: NEGATIVE

## 2024-05-01 LAB — CBC
HCT: 33.2 % — ABNORMAL LOW (ref 36.0–46.0)
HCT: 33.6 % — ABNORMAL LOW (ref 36.0–46.0)
Hemoglobin: 10.9 g/dL — ABNORMAL LOW (ref 12.0–15.0)
Hemoglobin: 11.5 g/dL — ABNORMAL LOW (ref 12.0–15.0)
MCH: 26.5 pg (ref 26.0–34.0)
MCH: 27 pg (ref 26.0–34.0)
MCHC: 32.8 g/dL (ref 30.0–36.0)
MCHC: 34.2 g/dL (ref 30.0–36.0)
MCV: 80.6 fL (ref 80.0–100.0)
MCV: 78.9 fL — ABNORMAL LOW (ref 80.0–100.0)
Platelets: 209 10*3/uL (ref 150–400)
Platelets: 210 10*3/uL (ref 150–400)
RBC: 4.12 MIL/uL (ref 3.87–5.11)
RBC: 4.26 MIL/uL (ref 3.87–5.11)
RDW: 15.1 % (ref 11.5–15.5)
RDW: 14.9 % (ref 11.5–15.5)
WBC: 9.1 10*3/uL (ref 4.0–10.5)
WBC: 9.3 10*3/uL (ref 4.0–10.5)
nRBC: 0 % (ref 0.0–0.2)
nRBC: 0 % (ref 0.0–0.2)

## 2024-05-01 LAB — SYPHILIS: RPR W/REFLEX TO RPR TITER AND TREPONEMAL ANTIBODIES, TRADITIONAL SCREENING AND DIAGNOSIS ALGORITHM: RPR Ser Ql: NONREACTIVE

## 2024-05-01 MED ORDER — EPHEDRINE 5 MG/ML INJ: 10.0000 mg | INTRAVENOUS | Status: DC | PRN

## 2024-05-01 MED ORDER — PHENYLEPHRINE 80 MCG/ML (10ML) SYRINGE FOR IV PUSH (FOR BLOOD PRESSURE SUPPORT): 80.0000 ug | PREFILLED_SYRINGE | INTRAVENOUS | Status: DC | PRN

## 2024-05-01 MED ORDER — LACTATED RINGERS IV SOLN
500.0000 mL | INTRAVENOUS | Status: DC | PRN
  Administered 2024-05-01: 500 mL via INTRAVENOUS

## 2024-05-01 MED ORDER — DIPHENHYDRAMINE HCL 50 MG/ML IJ SOLN: 12.5000 mg | INTRAMUSCULAR | Status: DC | PRN

## 2024-05-01 MED ORDER — GUAIFENESIN ER 600 MG PO TB12
600.0000 mg | ORAL_TABLET | Freq: Two times a day (BID) | ORAL | Status: DC | PRN
  Administered 2024-05-04: 600 mg via ORAL
  Filled 2024-05-01 (×2): qty 1

## 2024-05-01 MED ORDER — MISOPROSTOL 25 MCG QUARTER TABLET
25.0000 ug | ORAL_TABLET | Freq: Once | ORAL | Status: AC
  Administered 2024-05-01: 25 ug via VAGINAL
  Filled 2024-05-01: qty 1

## 2024-05-01 MED ORDER — OXYTOCIN-SODIUM CHLORIDE 30-0.9 UT/500ML-% IV SOLN: 2.5000 [IU]/h | INTRAVENOUS | Status: DC

## 2024-05-01 MED ORDER — LACTATED RINGERS IV SOLN: INTRAVENOUS | Status: DC

## 2024-05-01 MED ORDER — LIDOCAINE HCL (PF) 1 % IJ SOLN
INTRAMUSCULAR | Status: DC | PRN
  Administered 2024-05-01 (×2): 5 mL via EPIDURAL

## 2024-05-01 MED ORDER — OXYCODONE-ACETAMINOPHEN 5-325 MG PO TABS: 2.0000 | ORAL_TABLET | ORAL | Status: DC | PRN

## 2024-05-01 MED ORDER — PANTOPRAZOLE SODIUM 40 MG PO TBEC: 40.0000 mg | DELAYED_RELEASE_TABLET | Freq: Every day | ORAL | Status: DC

## 2024-05-01 MED ORDER — TERBUTALINE SULFATE 1 MG/ML IJ SOLN: 0.2500 mg | Freq: Once | INTRAMUSCULAR | Status: DC | PRN

## 2024-05-01 MED ORDER — OXYTOCIN-SODIUM CHLORIDE 30-0.9 UT/500ML-% IV SOLN
1.0000 m[IU]/min | INTRAVENOUS | Status: DC
  Administered 2024-05-01: 2 m[IU]/min via INTRAVENOUS
  Filled 2024-05-01: qty 500

## 2024-05-01 MED ORDER — OXYCODONE-ACETAMINOPHEN 5-325 MG PO TABS: 1.0000 | ORAL_TABLET | ORAL | Status: DC | PRN

## 2024-05-01 MED ORDER — FENTANYL-BUPIVACAINE-NACL 0.5-0.125-0.9 MG/250ML-% EP SOLN
12.0000 mL/h | EPIDURAL | Status: DC | PRN
  Administered 2024-05-01: 12 mL/h via EPIDURAL
  Filled 2024-05-01: qty 250

## 2024-05-01 MED ORDER — OXYTOCIN BOLUS FROM INFUSION: 333.0000 mL | Freq: Once | INTRAVENOUS | Status: DC

## 2024-05-01 MED ORDER — LACTATED RINGERS IV SOLN: 500.0000 mL | Freq: Once | INTRAVENOUS | Status: DC

## 2024-05-01 MED ORDER — ACETAMINOPHEN 325 MG PO TABS: 650.0000 mg | ORAL_TABLET | ORAL | Status: DC | PRN

## 2024-05-01 MED ORDER — LIDOCAINE HCL (PF) 1 % IJ SOLN: 30.0000 mL | INTRAMUSCULAR | Status: DC | PRN

## 2024-05-01 MED ORDER — ALBUTEROL SULFATE (2.5 MG/3ML) 0.083% IN NEBU
2.5000 mg | INHALATION_SOLUTION | RESPIRATORY_TRACT | Status: DC | PRN
  Filled 2024-05-01: qty 3

## 2024-05-01 MED ORDER — ONDANSETRON HCL 4 MG/2ML IJ SOLN: 4.0000 mg | Freq: Four times a day (QID) | INTRAMUSCULAR | Status: DC | PRN

## 2024-05-01 MED ORDER — FENTANYL CITRATE (PF) 100 MCG/2ML IJ SOLN: 50.0000 ug | INTRAMUSCULAR | Status: DC | PRN

## 2024-05-01 MED ORDER — SOD CITRATE-CITRIC ACID 500-334 MG/5ML PO SOLN
30.0000 mL | ORAL | Status: DC | PRN
  Filled 2024-05-01: qty 30

## 2024-05-01 MED ORDER — ALBUTEROL SULFATE HFA 108 (90 BASE) MCG/ACT IN AERS: 2.0000 | INHALATION_SPRAY | RESPIRATORY_TRACT | Status: DC | PRN

## 2024-05-01 NOTE — H&P (Addendum)
 Brooke Oconnor is a 29 y.o. female presenting for scheduled induction.  Pregnancy is complicated by:  1) Gestational diabetes: 3 hour GTT with 1/4 abnormal values at both 28 and 32 weeks. Started BG monitoring then. FBG were elevated at 34 weeks and metformin was prescribed, however BG improved following viral illness, prior to initiation of metformin. Reports BG wnl except for mildly elevated FBG but <100 last week with another viral illness 2) Maternal obesity: pre-pregnancy BMI 38 3) Asthma: mild intermittent 4) Rh negative status 5) Recurrent infections in pregnancy: RSV, sinusitis, and multiple other episodes of URI. Has seen ENT during pregnancy and diagnosed with deviated septum which will be revisited PP per patient  +FM. Denies LOF, VB, regular contractions  OB History     Gravida  2   Para  1   Term  1   Preterm      AB      Living  1      SAB      IAB      Ectopic      Multiple      Live Births             Past Medical History:  Diagnosis Date   Asthma    Elevated alkaline phosphatase level    Elevated LFTs    Gallstones    Gestational diabetes    Hypertension    Pregnancy induced hypertension    Past Surgical History:  Procedure Laterality Date   WISDOM TOOTH EXTRACTION     Family History: family history includes Diabetes in her brother, maternal aunt, maternal grandmother, and mother; Heart attack in her maternal grandfather and maternal grandmother; Heart disease in her paternal grandfather and paternal grandmother; Hypertension in her father, maternal grandmother, mother, paternal grandfather, and paternal grandmother; Lung cancer in her maternal grandfather; Thyroid disease in her maternal aunt, maternal grandmother, and mother. Social History:  reports that she has never smoked. She has never used smokeless tobacco. She reports that she does not currently use alcohol. She reports that she does not use drugs.     Maternal Diabetes: Yes:   Diabetes Type:  Diet controlled Genetic Screening: Normal Maternal Ultrasounds/Referrals: Normal Fetal Ultrasounds or other Referrals:  None Maternal Substance Abuse:  No Significant Maternal Medications:  Mucinex , amoxicillin, cefdinir, predisone, albuterol , claritin, budesonide nasal spray Significant Maternal Lab Results:  Group B Strep negative and Rh negative Number of Prenatal Visits:greater than 3 verified prenatal visits Maternal Vaccinations:Flu Other Comments:  None  Review of Systems  Constitutional:  Negative for chills and fever.  Eyes:  Negative for visual disturbance.  Respiratory:  Negative for shortness of breath.   Cardiovascular:  Negative for chest pain.  Gastrointestinal:  Negative for abdominal pain.  Genitourinary:  Positive for pelvic pain. Negative for vaginal bleeding and vaginal discharge.  Neurological:  Negative for headaches.   History Dilation: 1.5 Effacement (%): Thick Station: -3 Exam by:: Brooke Lema md Blood pressure (!) 143/82, pulse 89, temperature 97.9 F (36.6 C), temperature source Oral, resp. rate 20, height 5' 5 (1.651 m), weight 116 kg, unknown if currently breastfeeding. Exam Physical Exam Constitutional:      General: She is not in acute distress.    Appearance: She is obese.  HENT:     Head: Normocephalic and atraumatic.  Pulmonary:     Effort: Pulmonary effort is normal.  Musculoskeletal:        General: Normal range of motion.  Skin:    General: Skin is  warm and dry.  Neurological:     Mental Status: She is alert.  Psychiatric:        Mood and Affect: Mood normal.        Behavior: Behavior normal.     Prenatal labs: ABO, Rh: --/--/PENDING (01/28 9241) Antibody: PENDING (01/28 0758) Rubella: Immune (07/02 0000) RPR: Nonreactive (07/02 0000)  HBsAg: Negative (07/02 0000)  HIV: Non-reactive (07/02 0000)  GBS: Negative/-- (01/08 0000)   Assessment/Plan: 29 yo G2P1001 @ 39.0, IOL GDMA1 - IOL: 25 mcg PV cytotec  placed -  Saline lock/labor diet - cHTN: patient had elevated BP in prior pregnancy starting at 20 weeks. Elevated BP x 1 this pregnancy at 27 weeks. On admission, she has consistently elevated mild range BP. PreE labs obtained - GDMA1: accuchecks q4-->q2   Brooke Oconnor 05/01/2024, 10:13 AM

## 2024-05-01 NOTE — Anesthesia Procedure Notes (Signed)
 Epidural Patient location during procedure: OB Start time: 05/01/2024 11:06 PM End time: 05/01/2024 11:18 PM  Staffing Anesthesiologist: Jerrye Sharper, MD Performed: anesthesiologist   Preanesthetic Checklist Completed: patient identified, IV checked, site marked, risks and benefits discussed, surgical consent, monitors and equipment checked, pre-op evaluation and timeout performed  Epidural Patient position: sitting Prep: DuraPrep and site prepped and draped Patient monitoring: continuous pulse ox and blood pressure Approach: midline Location: L3-L4 Injection technique: LOR air  Needle:  Needle type: Tuohy  Needle gauge: 17 G Needle length: 9 cm and 9 Needle insertion depth: 6 cm Catheter type: closed end flexible Catheter size: 19 Gauge Catheter at skin depth: 11 cm Test dose: negative and Other  Assessment Events: blood not aspirated, no cerebrospinal fluid, injection not painful, no injection resistance, no paresthesia and negative IV test  Additional Notes Patient identified. Risks and benefits discussed including failed block, incomplete  Pain control, post dural puncture headache, nerve damage, paralysis, blood pressure Changes, nausea, vomiting, reactions to medications-both toxic and allergic and post Partum back pain. All questions were answered. Patient expressed understanding and wished to proceed. Sterile technique was used throughout procedure. Epidural site was Dressed with sterile barrier dressing. No paresthesias, signs of intravascular injection Or signs of intrathecal spread were encountered. Technically difficult. Multiple attempts. Poor positioning. Patient was more comfortable after the epidural was dosed. Please see RN's note for documentation of vital signs and FHR which are stable. Reason for block:procedure for pain

## 2024-05-01 NOTE — Progress Notes (Signed)
 Brooke Oconnor is a 29 y.o. G2P1001 at [redacted]w[redacted]d  Subjective: Mild cramping  Objective: BP 130/64   Pulse 85   Temp 98.3 F (36.8 C) (Oral)   Resp 20   Ht 5' 5 (1.651 m)   Wt 116 kg   BMI 42.56 kg/m  No intake/output data recorded. No intake/output data recorded.  FHT:  FHR: 120 bpm, variability: moderate,  accelerations:  Present,  decelerations:  Present variable x 1 UC:   4-5 min SVE:   Dilation: 2 Effacement (%): 60 Station: -3 Exam by:: Emaley Applin md  Labs: Lab Results  Component Value Date   WBC 9.1 05/01/2024   HGB 10.9 (L) 05/01/2024   HCT 33.2 (L) 05/01/2024   MCV 80.6 05/01/2024   PLT 209 05/01/2024    Assessment / Plan: 29 yo G2P1001 @ 39.0, IOL GDMA1  IOL: s/p PV miso x 1. Cervix still firm despite more dilation and effacement. Discussed options for next steps with patient, opted for 2nd misoprostol . Plan for pitocin  at next exam cHTN:  BP mild range, preE labs neg Fetal Wellbeing:  cat I-II Pain Control:  Labor support without medications Anticipated MOD:  NSVD  [name], DO 05/01/2024, 1:54 PM

## 2024-05-01 NOTE — Anesthesia Preprocedure Evaluation (Signed)
 "                                  Anesthesia Evaluation  Patient identified by MRN, date of birth, ID band Patient awake    Reviewed: Allergy & Precautions, Patient's Chart, lab work & pertinent test results  Airway Mallampati: II       Dental no notable dental hx. (+) Teeth Intact, Dental Advisory Given   Pulmonary asthma    Pulmonary exam normal breath sounds clear to auscultation       Cardiovascular hypertension, Pt. on medications Normal cardiovascular exam Rhythm:Regular Rate:Normal     Neuro/Psych negative neurological ROS  negative psych ROS   GI/Hepatic Neg liver ROS,GERD  ,,  Endo/Other  diabetes, Well Controlled, Gestational  Class 3 obesity  Renal/GU negative Renal ROS  negative genitourinary   Musculoskeletal negative musculoskeletal ROS (+)    Abdominal  (+) + obese  Peds  Hematology  (+) Blood dyscrasia, anemia Lab Results      Component                Value               Date                      WBC                      9.3                 05/01/2024                HGB                      11.5 (L)            05/01/2024                HCT                      33.6 (L)            05/01/2024                MCV                      78.9 (L)            05/01/2024                PLT                      210                 05/01/2024              Anesthesia Other Findings   Reproductive/Obstetrics (+) Pregnancy PIH GDM                              Anesthesia Physical Anesthesia Plan  ASA: 3  Anesthesia Plan: Epidural   Post-op Pain Management: Minimal or no pain anticipated   Induction: Intravenous  PONV Risk Score and Plan:   Airway Management Planned: Natural Airway  Additional Equipment: None and Fetal Monitoring  Intra-op Plan:   Post-operative Plan:   Informed Consent: I have reviewed the patients History  and Physical, chart, labs and discussed the procedure including the risks,  benefits and alternatives for the proposed anesthesia with the patient or authorized representative who has indicated his/her understanding and acceptance.       Plan Discussed with: Anesthesiologist  Anesthesia Plan Comments:         Anesthesia Quick Evaluation  "

## 2024-05-01 NOTE — Progress Notes (Signed)
 Brooke Oconnor is a 29 y.o. G2P1001 at [redacted]w[redacted]d   Subjective: CTX noticeable but manageable   Objective: BP 124/66   Pulse 87   Temp 97.6 F (36.4 C) (Oral)   Resp 19   Ht 5' 5 (1.651 m)   Wt 116 kg   BMI 42.56 kg/m  No intake/output data recorded. No intake/output data recorded.  FHT:  FHR: 150 bpm, variability: moderate,  accelerations:  Present,  decelerations:  Present late UC:   irregular, q1-6 min SVE:   Dilation: 2 Effacement (%): 60 Station: -3 Exam by:: Dr. Claudene  Labs: Lab Results  Component Value Date   WBC 9.1 05/01/2024   HGB 10.9 (L) 05/01/2024   HCT 33.2 (L) 05/01/2024   MCV 80.6 05/01/2024   PLT 209 05/01/2024    Assessment / Plan: 29 yo G2P1001 @ 39.0, IOL GDMA1   IOL: s/p PV miso x 2. Cervix now softer. Start pitocin .  cHTN:  BP normotensive to mild range GDMA1:  BG 131, repeat in 1 hour Fetal Wellbeing:  cat II Pain Control:  Labor support without medications Anticipated MOD:  NSVD  Brooke Oconnor Claudene, DO 05/01/2024, 6:24 PM

## 2024-05-01 NOTE — Progress Notes (Signed)
 Brooke Oconnor is a 29 y.o. G2P1001 at [redacted]w[redacted]d   Objective: BP (!) 115/52   Pulse 88   Temp 97.6 F (36.4 C) (Oral)   Resp 17   Ht 5' 5 (1.651 m)   Wt 116 kg   BMI 42.56 kg/m  No intake/output data recorded. No intake/output data recorded.  FHT:  FHR: 120 bpm, variability: moderate,  accelerations:  Present,  decelerations:  Present variable UC:   regular, every 2 minutes SVE:   2.5/60/-3  Labs: Lab Results  Component Value Date   WBC 9.3 05/01/2024   HGB 11.5 (L) 05/01/2024   HCT 33.6 (L) 05/01/2024   MCV 78.9 (L) 05/01/2024   PLT 210 05/01/2024    Assessment / Plan: 29 yo G2P1001 @ 39.0, IOL GDMA1   IOL: s/p PV miso x 2. Recent SROM. Pitocin  on 6. Cervix is largely unchanged and intermittent category II has precluded increasing pitocin  2x2. Hopefully with SROM she will begin to make more cervical change, however, I discussed with her if FHT intolerant of labor, would be indicated for c-section cHTN:  BP normotensive GDMA1:  BG q4-->q2, most recent 103 Fetal Wellbeing:  cat II Pain Control:  Labor support without medications, planning epidural soon   Brooke DELENA Sharps, DO 05/01/2024, 10:31 PM

## 2024-05-02 ENCOUNTER — Other Ambulatory Visit: Payer: Self-pay

## 2024-05-02 ENCOUNTER — Encounter (HOSPITAL_COMMUNITY): Payer: Self-pay | Admitting: Anesthesiology

## 2024-05-02 ENCOUNTER — Encounter (HOSPITAL_COMMUNITY)
Admission: RE | Disposition: A | Discharge status: Home / Self Care | Payer: Self-pay | Source: Home / Self Care | Attending: Student

## 2024-05-02 ENCOUNTER — Encounter (HOSPITAL_COMMUNITY): Payer: Self-pay | Admitting: Student

## 2024-05-02 LAB — CBC
HCT: 31.2 % — ABNORMAL LOW (ref 36.0–46.0)
Hemoglobin: 10.5 g/dL — ABNORMAL LOW (ref 12.0–15.0)
MCH: 26.6 pg (ref 26.0–34.0)
MCHC: 33.7 g/dL (ref 30.0–36.0)
MCV: 79.2 fL — ABNORMAL LOW (ref 80.0–100.0)
Platelets: 194 10*3/uL (ref 150–400)
RBC: 3.94 MIL/uL (ref 3.87–5.11)
RDW: 14.9 % (ref 11.5–15.5)
WBC: 18 10*3/uL — ABNORMAL HIGH (ref 4.0–10.5)
nRBC: 0 % (ref 0.0–0.2)

## 2024-05-02 LAB — GLUCOSE, CAPILLARY: Glucose-Capillary: 109 mg/dL — ABNORMAL HIGH (ref 70–99)

## 2024-05-02 MED ORDER — SODIUM CHLORIDE 0.9% FLUSH: 3.0000 mL | INTRAVENOUS | Status: DC | PRN

## 2024-05-02 MED ORDER — DEXAMETHASONE SOD PHOSPHATE PF 10 MG/ML IJ SOLN
INTRAMUSCULAR | Status: DC | PRN
  Administered 2024-05-02: 10 mg via INTRAVENOUS

## 2024-05-02 MED ORDER — LACTATED RINGERS IV SOLN: INTRAVENOUS | Status: DC | PRN

## 2024-05-02 MED ORDER — LACTATED RINGERS AMNIOINFUSION: INTRAVENOUS | Status: DC

## 2024-05-02 MED ORDER — DIBUCAINE (PERIANAL) 1 % EX OINT: 1.0000 | TOPICAL_OINTMENT | CUTANEOUS | Status: DC | PRN

## 2024-05-02 MED ORDER — OXYTOCIN-SODIUM CHLORIDE 30-0.9 UT/500ML-% IV SOLN
2.5000 [IU]/h | INTRAVENOUS | Status: AC
  Administered 2024-05-02: 2.5 [IU]/h via INTRAVENOUS

## 2024-05-02 MED ORDER — COCONUT OIL OIL: 1.0000 | TOPICAL_OIL | Status: DC | PRN

## 2024-05-02 MED ORDER — ONDANSETRON HCL 4 MG/2ML IJ SOLN
INTRAMUSCULAR | Status: DC | PRN
  Administered 2024-05-02: 4 mg via INTRAVENOUS

## 2024-05-02 MED ORDER — PRENATAL MULTIVITAMIN CH
1.0000 | ORAL_TABLET | Freq: Every day | ORAL | Status: DC
  Administered 2024-05-02 – 2024-05-04 (×3): 1 via ORAL
  Filled 2024-05-02 (×3): qty 1

## 2024-05-02 MED ORDER — KETOROLAC TROMETHAMINE 30 MG/ML IJ SOLN
30.0000 mg | Freq: Four times a day (QID) | INTRAMUSCULAR | Status: DC | PRN
  Administered 2024-05-02: 30 mg via INTRAVENOUS

## 2024-05-02 MED ORDER — FENTANYL CITRATE (PF) 100 MCG/2ML IJ SOLN
25.0000 ug | INTRAMUSCULAR | Status: DC | PRN
  Administered 2024-05-02 (×3): 50 ug via INTRAVENOUS

## 2024-05-02 MED ORDER — MORPHINE SULFATE (PF) 0.5 MG/ML IJ SOLN
INTRAMUSCULAR | Status: AC
  Filled 2024-05-02: qty 10

## 2024-05-02 MED ORDER — WITCH HAZEL-GLYCERIN EX PADS: 1.0000 | MEDICATED_PAD | CUTANEOUS | Status: DC | PRN

## 2024-05-02 MED ORDER — OXYCODONE HCL 5 MG PO TABS
5.0000 mg | ORAL_TABLET | ORAL | Status: DC | PRN
  Administered 2024-05-03: 5 mg via ORAL
  Filled 2024-05-02: qty 1

## 2024-05-02 MED ORDER — ONDANSETRON HCL 4 MG/2ML IJ SOLN: 4.0000 mg | Freq: Three times a day (TID) | INTRAMUSCULAR | Status: DC | PRN

## 2024-05-02 MED ORDER — SOD CITRATE-CITRIC ACID 500-334 MG/5ML PO SOLN
30.0000 mL | ORAL | Status: AC
  Administered 2024-05-02: 30 mL via ORAL

## 2024-05-02 MED ORDER — DIPHENHYDRAMINE HCL 50 MG/ML IJ SOLN: 12.5000 mg | INTRAMUSCULAR | Status: DC | PRN

## 2024-05-02 MED ORDER — ENOXAPARIN SODIUM 60 MG/0.6ML IJ SOSY
60.0000 mg | PREFILLED_SYRINGE | INTRAMUSCULAR | Status: DC
  Administered 2024-05-02 – 2024-05-03 (×2): 60 mg via SUBCUTANEOUS
  Filled 2024-05-02 (×2): qty 0.6

## 2024-05-02 MED ORDER — KETOROLAC TROMETHAMINE 30 MG/ML IJ SOLN
INTRAMUSCULAR | Status: AC
  Filled 2024-05-02: qty 1

## 2024-05-02 MED ORDER — FENTANYL CITRATE (PF) 100 MCG/2ML IJ SOLN
INTRAMUSCULAR | Status: AC
  Filled 2024-05-02: qty 2

## 2024-05-02 MED ORDER — ACETAMINOPHEN 10 MG/ML IV SOLN
INTRAVENOUS | Status: DC | PRN
  Administered 2024-05-02: 1000 mg via INTRAVENOUS

## 2024-05-02 MED ORDER — DEXAMETHASONE SOD PHOSPHATE PF 10 MG/ML IJ SOLN
INTRAMUSCULAR | Status: AC
  Filled 2024-05-02: qty 1

## 2024-05-02 MED ORDER — SODIUM CHLORIDE 0.9 % IV SOLN
INTRAVENOUS | Status: AC
  Filled 2024-05-02: qty 5

## 2024-05-02 MED ORDER — OXYTOCIN-SODIUM CHLORIDE 30-0.9 UT/500ML-% IV SOLN
INTRAVENOUS | Status: DC | PRN
  Administered 2024-05-02: 30 [IU] via INTRAVENOUS

## 2024-05-02 MED ORDER — KETOROLAC TROMETHAMINE 30 MG/ML IJ SOLN: 30.0000 mg | Freq: Four times a day (QID) | INTRAMUSCULAR | Status: DC | PRN

## 2024-05-02 MED ORDER — SODIUM CHLORIDE 0.9 % IV SOLN: INTRAVENOUS | Status: DC | PRN

## 2024-05-02 MED ORDER — ZOLPIDEM TARTRATE 5 MG PO TABS: 5.0000 mg | ORAL_TABLET | Freq: Every evening | ORAL | Status: DC | PRN

## 2024-05-02 MED ORDER — TRANEXAMIC ACID-NACL 1000-0.7 MG/100ML-% IV SOLN
INTRAVENOUS | Status: AC
  Filled 2024-05-02: qty 100

## 2024-05-02 MED ORDER — MORPHINE SULFATE (PF) 0.5 MG/ML IJ SOLN
INTRAMUSCULAR | Status: DC | PRN
  Administered 2024-05-02: 3 mg via EPIDURAL

## 2024-05-02 MED ORDER — DIPHENHYDRAMINE HCL 25 MG PO CAPS: 25.0000 mg | ORAL_CAPSULE | Freq: Four times a day (QID) | ORAL | Status: DC | PRN

## 2024-05-02 MED ORDER — KETOROLAC TROMETHAMINE 30 MG/ML IJ SOLN
30.0000 mg | Freq: Four times a day (QID) | INTRAMUSCULAR | Status: AC
  Administered 2024-05-02 (×2): 30 mg via INTRAVENOUS
  Filled 2024-05-02 (×2): qty 1

## 2024-05-02 MED ORDER — SIMETHICONE 80 MG PO CHEW: 80.0000 mg | CHEWABLE_TABLET | ORAL | Status: DC | PRN

## 2024-05-02 MED ORDER — MEPERIDINE HCL 25 MG/ML IJ SOLN: 6.2500 mg | INTRAMUSCULAR | Status: DC | PRN

## 2024-05-02 MED ORDER — ACETAMINOPHEN 500 MG PO TABS
1000.0000 mg | ORAL_TABLET | Freq: Four times a day (QID) | ORAL | Status: DC
  Administered 2024-05-02 – 2024-05-04 (×8): 1000 mg via ORAL
  Filled 2024-05-02 (×8): qty 2

## 2024-05-02 MED ORDER — ONDANSETRON HCL 4 MG/2ML IJ SOLN
INTRAMUSCULAR | Status: AC
  Filled 2024-05-02: qty 2

## 2024-05-02 MED ORDER — OXYTOCIN-SODIUM CHLORIDE 30-0.9 UT/500ML-% IV SOLN
INTRAVENOUS | Status: AC
  Filled 2024-05-02: qty 500

## 2024-05-02 MED ORDER — FENTANYL CITRATE (PF) 100 MCG/2ML IJ SOLN
INTRAMUSCULAR | Status: DC | PRN
  Administered 2024-05-02: 50 ug via EPIDURAL
  Administered 2024-05-02: 100 ug via EPIDURAL
  Administered 2024-05-02: 50 ug via EPIDURAL

## 2024-05-02 MED ORDER — TRANEXAMIC ACID-NACL 1000-0.7 MG/100ML-% IV SOLN
INTRAVENOUS | Status: DC | PRN
  Administered 2024-05-02: 1000 mg via INTRAVENOUS

## 2024-05-02 MED ORDER — SODIUM CHLORIDE 0.9 % IV SOLN
500.0000 mg | INTRAVENOUS | Status: AC
  Administered 2024-05-02: 500 mg via INTRAVENOUS

## 2024-05-02 MED ORDER — LIDOCAINE-EPINEPHRINE (PF) 2 %-1:200000 IJ SOLN
INTRAMUSCULAR | Status: DC | PRN
  Administered 2024-05-02 (×2): 2 mL via EPIDURAL
  Administered 2024-05-02: 10 mL via EPIDURAL

## 2024-05-02 MED ORDER — ACETAMINOPHEN 10 MG/ML IV SOLN
INTRAVENOUS | Status: AC
  Filled 2024-05-02: qty 100

## 2024-05-02 MED ORDER — MENTHOL 3 MG MT LOZG: 1.0000 | LOZENGE | OROMUCOSAL | Status: DC | PRN

## 2024-05-02 MED ORDER — AZITHROMYCIN 500 MG PO TABS: 1000.0000 mg | ORAL_TABLET | ORAL | Status: DC

## 2024-05-02 MED ORDER — SENNOSIDES-DOCUSATE SODIUM 8.6-50 MG PO TABS
2.0000 | ORAL_TABLET | Freq: Every day | ORAL | Status: DC
  Administered 2024-05-03 – 2024-05-04 (×2): 2 via ORAL
  Filled 2024-05-02 (×2): qty 2

## 2024-05-02 MED ORDER — PANTOPRAZOLE SODIUM 40 MG PO TBEC
40.0000 mg | DELAYED_RELEASE_TABLET | Freq: Every day | ORAL | Status: DC
  Administered 2024-05-03 – 2024-05-04 (×2): 40 mg via ORAL
  Filled 2024-05-02 (×2): qty 1

## 2024-05-02 MED ORDER — IBUPROFEN 600 MG PO TABS
600.0000 mg | ORAL_TABLET | Freq: Four times a day (QID) | ORAL | Status: DC
  Administered 2024-05-02 – 2024-05-04 (×7): 600 mg via ORAL
  Filled 2024-05-02 (×7): qty 1

## 2024-05-02 MED ORDER — NALOXONE HCL 4 MG/10ML IJ SOLN: 1.0000 ug/kg/h | INTRAVENOUS | Status: DC | PRN

## 2024-05-02 MED ORDER — SIMETHICONE 80 MG PO CHEW
80.0000 mg | CHEWABLE_TABLET | Freq: Three times a day (TID) | ORAL | Status: DC
  Administered 2024-05-02 – 2024-05-04 (×6): 80 mg via ORAL
  Filled 2024-05-02 (×7): qty 1

## 2024-05-02 MED ORDER — CEFAZOLIN SODIUM-DEXTROSE 2-4 GM/100ML-% IV SOLN
2.0000 g | INTRAVENOUS | Status: AC
  Administered 2024-05-02: 2 g via INTRAVENOUS

## 2024-05-02 MED ORDER — DIPHENHYDRAMINE HCL 25 MG PO CAPS: 25.0000 mg | ORAL_CAPSULE | ORAL | Status: DC | PRN

## 2024-05-02 MED ORDER — NALOXONE HCL 0.4 MG/ML IJ SOLN: 0.4000 mg | INTRAMUSCULAR | Status: DC | PRN

## 2024-05-02 MED ORDER — GABAPENTIN 100 MG PO CAPS
200.0000 mg | ORAL_CAPSULE | Freq: Two times a day (BID) | ORAL | Status: DC
  Administered 2024-05-02 – 2024-05-04 (×5): 200 mg via ORAL
  Filled 2024-05-02 (×5): qty 2

## 2024-05-02 MED ORDER — DEXMEDETOMIDINE HCL IN NACL 400 MCG/100ML IV SOLN
INTRAVENOUS | Status: DC | PRN
  Administered 2024-05-02 (×2): 4 ug via INTRAVENOUS

## 2024-05-02 NOTE — Progress Notes (Signed)
 Postpartum Day 0: Cesarean Delivery  Subjective: Patient reports she is doing well. Pain is well controlled with PO pain medication, slightly exacerbated with movement. Ambulating without lightheadedness or dizziness. Tolerating regular diet w/o N/V. Foley removed and she is voiding without difficulty. She is breastfeeding.     Objective: Vital signs in last 24 hours: Temp:  [97.6 F (36.4 C)-98.6 F (37 C)] 98 F (36.7 C) (01/29 1709) Pulse Rate:  [71-99] 85 (01/29 1709) Resp:  [15-25] 18 (01/29 1709) BP: (105-144)/(46-93) 114/57 (01/29 1709) SpO2:  [95 %-100 %] 97 % (01/29 1709)  Physical Exam:  General: alert and no distress Respiratory: no increased work of breathing  Lochia: appropriate Uterine Fundus: not assessed Incision: not assessed  Recent Labs    05/01/24 1805 05/02/24 0501  HGB 11.5* 10.5*  HCT 33.6* 31.2*    Assessment/Plan: 29 yo G2P2002 now POD0 from pCS for NRFHT remote from delivery  Post-operative care: doing well Post-partum: breast-feeding, female fetus - desire circumcision  A1GDM: AM FSBS and will ned 2hr gtt on discharge  Rh negative: fetus O negative  Obese - pLovenox ordered  Dispo: anticipate discharge POD1-2  Charmaine CHRISTELLA Oz, MD 05/02/2024, 10:06 PM

## 2024-05-02 NOTE — Anesthesia Postprocedure Evaluation (Signed)
"   Anesthesia Post Note  Patient: Brooke Oconnor  Procedure(s) Performed: CESAREAN DELIVERY     Patient location during evaluation: PACU Anesthesia Type: Epidural Level of consciousness: oriented and awake and alert Pain management: pain level controlled Vital Signs Assessment: post-procedure vital signs reviewed and stable Respiratory status: spontaneous breathing, respiratory function stable and nonlabored ventilation Cardiovascular status: blood pressure returned to baseline and stable Postop Assessment: no headache, no backache, no apparent nausea or vomiting, epidural receding and patient able to bend at knees Anesthetic complications: no   No notable events documented.  Last Vitals:  Vitals:   05/02/24 0405 05/02/24 0406  BP: 130/68   Pulse: 95 99  Resp: 18 20  Temp:    SpO2: 97% 95%    Last Pain:  Vitals:   05/02/24 0406  TempSrc:   PainSc: 9    Pain Goal:    LLE Motor Response: Purposeful movement (05/02/24 0350) LLE Sensation: Tingling (05/02/24 0350) RLE Motor Response: Purposeful movement (05/02/24 0350) RLE Sensation: Tingling (05/02/24 0350)     Epidural/Spinal Function Cutaneous sensation: Tingles (05/02/24 0350)  [name]      "

## 2024-05-02 NOTE — Progress Notes (Signed)
 Brooke Oconnor is a 29 y.o. G2P1001 at [redacted]w[redacted]d   Objective: BP 135/81   Pulse 99   Temp 97.6 F (36.4 C) (Oral)   Resp 17   Ht 5' 5 (1.651 m)   Wt 116 kg   SpO2 99%   BMI 42.56 kg/m  No intake/output data recorded. No intake/output data recorded.  FHT:  FHR: 135 bpm, variability: moderate,  accelerations:  Present,  decelerations:  Present recurrent variable and late UC:   regular, every 2-4 minutes SVE:   Dilation: 3 Effacement (%): 90 Station: -2 Exam by:: Dr. Claudene  Labs: Lab Results  Component Value Date   WBC 9.3 05/01/2024   HGB 11.5 (L) 05/01/2024   HCT 33.6 (L) 05/01/2024   MCV 78.9 (L) 05/01/2024   PLT 210 05/01/2024    Assessment / Plan: 29 yo G2P1001 @ 39.0, IOL GDMA1   IOL: s/p PV miso x 2, s/p SROM. IUPC/Amnio were placed but decelerations persist with minimal cervical change. Recommended cesarean delivery given remote from delivery.   Reviewed R/B with patient and husband. Risks include but not limited to bleeding, infection, injury to nearby structures (bowel, bladder, vasculature, nerves), need for hysterectomy for refractory bleeding. Questions from both patient and husband were solicited and answered. Patient agrees to move forward with cesarean delivery  Fetal Wellbeing:  cat II MOD  C-section  [name] Claudene, DO 05/02/2024, 1:27 AM

## 2024-05-02 NOTE — Transfer of Care (Addendum)
 Immediate Anesthesia Transfer of Care Note  Patient: Brooke Oconnor  Procedure(s) Performed: CESAREAN DELIVERY  Patient Location: PACU  Anesthesia Type:Epidural  Level of Consciousness: awake, alert , and oriented  Airway & Oxygen Therapy: Patient Spontanous Breathing  Post-op Assessment: Report given to RN and Post -op Vital signs reviewed and stable  Post vital signs: Reviewed and stable  Last Vitals:  Vitals Value Taken Time  BP    Temp    Pulse    Resp    SpO2      Last Pain:  Vitals:   05/01/24 1930  TempSrc:   PainSc: 0-No pain         Complications: No notable events documented.

## 2024-05-02 NOTE — Op Note (Signed)
 C-Section Operative Note  Date: 05/02/24  Preoperative Diagnosis: [redacted] weeks gestation Category II fetal heart tracing remote from delivery  Gestational diabetes, A1 Chronic hypertension, not on medication  Postoperative Diagnosis:  [redacted] weeks gestation Category II fetal heart tracing remote from delivery  Gestational diabetes Chronic hypertension  Procedure: Primary low transverse cesarean section  Surgeon: LOIS Sharps, DO  Operative Findings: Gravid uterus. Clear amniotic fluid. Viable female infant in cephalic position weighing 3450g with APGARS of 8 and 9 at 1 and 5 minutes, respectively. Normal fallopian tubes and ovaries bilaterally. Specimens: Placenta to L&D EBL 379 UOP 100  Patient Course: Patient admitted for induction of labor due to well-controlled gestational diabetes. Induced with misoprostol  and had spontaneous rupture of membranes. Fetus had persistent category II fetal heart tracing with variable and late decelerations in latent phase at 3 cm so cesarean delivery was recommended  Consent:  R/B/A of cesarean section discussed with patient. Alternative would be vaginal delivery which would mean shorter postpartum stay and decreased risk of bleeding. Risks of cesarean section include but are not limited to infection of the uterus, pelvic organs, or skin, inadvertent injury to internal organs, such as bowel or bladder, vasculature, and nerves. If there is major injury, extensive surgery may be required. If injury is minor, it may be treated with relative ease and repaired at the time of injury. Discussed possibility of excessive blood loss and transfusion. If bleeding cannot be controlled using medical or minor surgical methods, a cesarean hysterectomy may be performed which would mean no future fertility. Patient accepts the possibility of blood transfusion, if necessary. Patient understands and agrees to move forward with section.   Operative Procedure: Patient was taken to the  operating room where her epidural anesthesia was bolused found to be adequate by Allis clamp test. She had a foley catheter in place from her labor course. She was prepped and draped in the normal sterile fashion in the dorsal supine position with a leftward tilt. An appropriate time out was performed. A Pfannenstiel skin incision was then made with the scalpel and carried through to the underlying layer of fascia by sharp dissection and Bovie cautery. The fascia was nicked in the midline and the incision was extended laterally with Mayo scissors. The superior aspect of the incision was grasped Kocher clamps and dissected off the underlying rectus muscles. In a similar fashion, the inferior aspect was dissected off the rectus muscles. Rectus muscles were separated in the midline and the peritoneal cavity entered bluntly. The peritoneal incision was then extended both superiorly and inferiorly with careful attention to avoid both bowel and bladder. The Misha self-retaining wound retractor was then placed within the incision and the lower uterine segment exposed. The bladder flap was developed with Metzenbaum scissors and pushed away from the lower uterine segment. The lower uterine segment was then incised in a low transverse fashion and the cavity itself entered bluntly. The incision was extended bluntly. Amniotic sac was ruptured and fluid was noted to be clear in color. The infant's head was then lifted and delivered from the incision without difficulty using the standard movements. The remainder of the infant delivered and the nose and mouth bulb suctioned with the cord clamped and cut as well after 1 minute. The infant was handed off to the waiting neonatology team. The placenta was then spontaneously expressed from the uterus and the uterus cleared of all clots and debris with moist lap sponge. The uterine incision was then repaired in 2 layers  the first layer was a running locked layer of 0-vicryl and the  second an imbricating layer of the same suture. There was a 3x1.5 cm hematoma at the right superior hysterotomy edge overlying a sinus which was tamponaded with a figure-of-eight stitch with 2-0 vicryl. It was stable and non-expanding after prolonged observation. The uterine incision was inspected and found to be hemostatic. All instruments and sponges as well as the Desaree retractor were then removed from the abdomen. The peritoneum was then then reapproximated with a running non-locked 2-0 Vicryl. The fascia was then closed with 0 Vicryl in a running fashion. Subcutaneous tissue was irrigated, dried, and then reapproximated with 2-0 plain in a running fashion. The skin was closed with a subcuticular stitch of 4-0 Vicryl on a Keith needle and then reinforced with dermabond and a Honeycomb dressing. At the conclusion of the procedure all instruments and sponge counts were correct. Patient was taken to the recovery room in good condition with her baby accompanying her skin to skin.   CLAUDENE MORT

## 2024-05-03 LAB — CBC
HCT: 27.3 % — ABNORMAL LOW (ref 36.0–46.0)
Hemoglobin: 9.3 g/dL — ABNORMAL LOW (ref 12.0–15.0)
MCH: 27.1 pg (ref 26.0–34.0)
MCHC: 34.1 g/dL (ref 30.0–36.0)
MCV: 79.6 fL — ABNORMAL LOW (ref 80.0–100.0)
Platelets: 186 10*3/uL (ref 150–400)
RBC: 3.43 MIL/uL — ABNORMAL LOW (ref 3.87–5.11)
RDW: 15.3 % (ref 11.5–15.5)
WBC: 11.4 10*3/uL — ABNORMAL HIGH (ref 4.0–10.5)
nRBC: 0 % (ref 0.0–0.2)

## 2024-05-03 LAB — GLUCOSE, CAPILLARY: Glucose-Capillary: 79 mg/dL (ref 70–99)

## 2024-05-03 LAB — GLUCOSE, RANDOM: Glucose, Bld: 84 mg/dL (ref 70–99)

## 2024-05-03 NOTE — Progress Notes (Signed)
 Postpartum Day 1: Cesarean Delivery  Subjective: Patient reports she is doing well. Pain is well controlled with PO pain medication. Ambulating without lightheadedness or dizziness. Tolerating regular diet w/o N/V. Voiding without issue, some flatus but feels gassy. She is breastfeeding/supplementing. Baby with weight loss precluding circ     Objective: Vital signs in last 24 hours: Temp:  [97.8 F (36.6 C)-98 F (36.7 C)] 97.9 F (36.6 C) (01/30 0839) Pulse Rate:  [81-94] 94 (01/30 0839) Resp:  [16-18] 16 (01/30 0839) BP: (105-121)/(49-69) 108/49 (01/30 0839) SpO2:  [96 %-100 %] 99 % (01/30 0839)  Physical Exam:  General: alert and no distress Respiratory: no increased work of breathing  Lochia: appropriate Uterine Fundus: firm, 1cm below umbilicus Incision: CDI  Recent Labs    05/02/24 0501 05/03/24 0601  HGB 10.5* 9.3*  HCT 31.2* 27.3*    Assessment/Plan: 29 yo G2P2002 now POD1 from pCS for NRFHT remote from delivery  Post-operative care: doing well Post-partum: breast-feeding and supplementing, female fetus - desire circumcision but defer 2/2 weight loss A1GDM: AM FSBS and will ned 2hr gtt on discharge  Rh negative: fetus O negative  Obese - pLovenox ordered  Dispo: anticipate discharge POD2  Lavonia CHRISTELLA Guppy, MD 05/03/2024, 11:21 AM

## 2024-05-03 NOTE — Lactation Note (Signed)
 This note was copied from a baby's chart. Lactation Consultation Note  Patient Name: Brooke Oconnor Unijb'd Date: 05/03/2024 Age:31 hours Reason for consult: Follow-up assessment;Infant weight loss;Term  P2. RN notified LC of 11% weight loss and baby is 53 hrs old.  Baby has had 5 voids, 4 stools and 1 emesis (documented). Mom stated baby has been spitting up a lot of mucous. More than 1. Mom is going to supplement w/formula. Baby is getting PKU and heart screen done at this time then Tech is going to re-weigh baby per [facility] request. Then mom is going to supplement w/formula. LC suggested mom BF for 15 min. Then supplement 15 min for total feeding not exceeding longer than 30 min.  Maternal Data    Feeding Mother's Current Feeding Choice: Breast Milk and Formula  LATCH Score                    Lactation Tools Discussed/Used    Interventions    Discharge    Consult Status Consult Status: Follow-up Date: 05/03/24 Follow-up type: In-patient    Quaneshia Wareing G 05/03/2024, 6:46 AM

## 2024-05-03 NOTE — Lactation Note (Signed)
 This note was copied from a baby's chart. Lactation Consultation Note  Patient Name: Brooke Oconnor Date: 05/03/2024 Age:31 hours Reason for consult: Initial assessment;Term;Maternal endocrine disorder  P2. Mom stated the baby is BF about 10 minutes each time. Mom denies difficulty latching or painful latches. Discussed s/sx of hypoglycemia. Mom states she hasn't seen any signs of that. Newborn feeding habits, behavior, STS, I&O reviewed. Mom encouraged to feed baby 8-12 times/24 hours and with feeding cues.  Mom feeling good about how BF is going so far. Praised mom. Encouraged mom to call for assistance as needed. Maternal Data Does the patient have breastfeeding experience prior to this delivery?: Yes How long did the patient breastfeed?: 3 months to her now 29 yr old  Feeding    LATCH Score       Type of Nipple: Everted at rest and after stimulation  Comfort (Breast/Nipple): Soft / non-tender         Lactation Tools Discussed/Used    Interventions Interventions: Breast feeding basics reviewed;Education;LC Services brochure  Discharge Pump: DEBP (Spectra )  Consult Status Consult Status: Follow-up Date: 05/03/24 Follow-up type: In-patient    Tysean Vandervliet G 05/03/2024, 12:09 AM

## 2024-05-04 MED ORDER — ACETAMINOPHEN 325 MG PO TABS: 650.0000 mg | ORAL_TABLET | Freq: Four times a day (QID) | ORAL | Status: AC | PRN

## 2024-05-04 MED ORDER — IBUPROFEN 600 MG PO TABS: 600.0000 mg | ORAL_TABLET | Freq: Four times a day (QID) | ORAL | 1 refills | Status: AC

## 2024-05-04 MED ORDER — SENNOSIDES-DOCUSATE SODIUM 8.6-50 MG PO TABS: 2.0000 | ORAL_TABLET | Freq: Every day | ORAL | Status: AC

## 2024-05-04 MED ORDER — OXYCODONE HCL 5 MG PO TABS: 5.0000 mg | ORAL_TABLET | ORAL | 0 refills | Status: AC | PRN

## 2024-05-04 MED ORDER — PANTOPRAZOLE SODIUM 40 MG PO TBEC: 40.0000 mg | DELAYED_RELEASE_TABLET | Freq: Every day | ORAL | 11 refills | Status: AC

## 2024-05-04 NOTE — Progress Notes (Signed)
 Postpartum Day 2: Cesarean Delivery  Subjective: Patient reports she is doing well. Pain is well controlled with PO pain medication. Ambulating without lightheadedness or dizziness. Tolerating regular diet w/o N/V. Voiding without issue, passing flatus. She is breastfeeding/supplementing.   Objective: Vital signs in last 24 hours: Temp:  [98 F (36.7 C)-98.8 F (37.1 C)] 98.4 F (36.9 C) (01/31 0532) Pulse Rate:  [83-96] 83 (01/31 0532) Resp:  [17-18] 18 (01/31 0532) BP: (111-137)/(56-74) 111/59 (01/31 0532) SpO2:  [100 %] 100 % (01/31 0532)  Physical Exam:  General: alert and no distress Respiratory: no increased work of breathing  Lochia: appropriate Uterine Fundus: firm Incision: CDI  Recent Labs    05/02/24 0501 05/03/24 0601  HGB 10.5* 9.3*  HCT 31.2* 27.3*    Assessment/Plan: 29 yo G2P2002 now POD2 from pCS for NRFHT remote from delivery  Post-operative care: doing well, routine Rh negative: fetus O negative  VTE prophylaxis: Lovenox  ordered Desires neonatal circumcision, R/B/A of procedure discussed at length. Pt understands that neonatal circumcision is not considered medically necessary and is elective. The risks include, but are not limited to bleeding, infection, damage to the penis, development of scar tissue, and having to have it redone at a later date. Pt understands theses risks and wishes to proceed.  Dispo: meeting discharge milestones, instructions reviewed. Incision check next week.   Rosaline FORBES Chapel, MD 05/04/2024, 1:55 PM

## 2024-05-04 NOTE — Lactation Note (Signed)
 This note was copied from a baby's chart. Lactation Consultation Note  Patient Name: Brooke Oconnor Unijb'd Date: 05/04/2024 Age:31 hours Reason for consult: Maternal discharge;Term (Infant had weight loss change -11.73% to 11:45%, C/S delivery. See MOB: MR- hx GDM and PIH.) Per MOB, infant is breastfeeding for 15 minutes most feedings, MOB is supplementing infant with 20 kcal formula each feeding now, at his last feeding he consumed 16 mls of 20 kcal formula. LC discussed to continue to breastfeed infant every 3 hours and continue to limit breast ( chest ) feeding to 15 minutes, until infant regains his birth weight, continue to supplement infant  immediately after he breast feed but offer 25 to 30 mls per feeding due infant being 76 days old. MOB will continue to pump every 3 hours for 15 minutes, Per MOB, she is seeing drops of colostrum when pumping. MOB declined Outpatient LC support at this time. MOB has community breastfeeding resources: [employer] hotline and Breastfeeding Support group.   Discharge education: 1- LC discussed engorgement prevention and treatment. 2- LC discussed warning signs of dehydration in infant. 3- LC suggested MOB use breastfeeding community resources if needed.  Maternal Data    Feeding Mother's Current Feeding Choice: Breast Milk and Formula  LATCH Score  LC did not observe latch at this time. Family is being discharged .                  Lactation Tools Discussed/Used    Interventions Interventions: DEBP;Education;Pace feeding;CDC milk storage guidelines;CDC Guidelines for Breast Pump Cleaning;Guidelines for Milk Supply and Pumping Schedule Handout  Discharge Discharge Education: Warning signs for feeding baby;Engorgement and breast care (MOB declined [facility] outpatient clinic at this time.) Pump: DEBP;Personal  Consult Status Consult Status: Complete Date: 05/04/24 Follow-up type: Physician    Grayce LULLA Batter 05/04/2024, 3:14 PM

## 2024-05-04 NOTE — Discharge Summary (Signed)
 "    Postpartum Discharge Summary  Date of Service updated 05/04/24      Patient Name: Brooke Oconnor DOB: 1995/04/22 MRN: [id number]  Date of admission: 05/01/2024 Delivery date:05/02/2024 Delivering provider: CLAUDENE MORT A Date of discharge: 05/04/2024  Admitting diagnosis: Gestational diabetes mellitus (GDM) in third trimester [O24.419] Intrauterine pregnancy: [redacted]w[redacted]d     Secondary diagnosis:  Principal Problem:   Gestational diabetes mellitus (GDM) in third trimester Obesity, asthma, rh neg, chronic hypertension Additional problems: none    Discharge diagnosis: Term Pregnancy Delivered                                              Post partum procedures:n/a Augmentation: N/A Complications: None  Hospital course: Induction of Labor With Cesarean Section   29 y.o. yo [id number] at [redacted]w[redacted]d was admitted to the hospital 05/01/2024 for induction of labor. Patient had a labor course significant for decelerations. The patient went for cesarean section due to Non-Reassuring FHR. Delivery details are as follows: Membrane Rupture Time/Date: 10:20 PM,05/01/2024  Delivery Method:C-Section, Low Transverse Operative Delivery:N/A Details of operation can be found in separate operative Note.  Patient had a postpartum course complicated by nothing. She is ambulating, tolerating a regular diet, passing flatus, and urinating well.  Patient is discharged home in stable condition on 05/04/24.      Newborn Data: Birth date:05/02/2024 Birth time:2:19 AM Gender:Female Living status:Living Apgars:8 ,9  Weight:3450 g                               Magnesium Sulfate received: No BMZ received: No Rhophylac:N/A MMR:N/A T-DaP:no Flu: Yes RSV Vaccine received: No Transfusion:No Immunizations administered: Immunization History  Administered Date(s) Administered   Moderna Sars-Covid-2 Vaccination 01/16/2020, 02/28/2020   PPD Test 09/13/2023    Physical exam  Vitals:   05/03/24 0839 05/03/24 1545 05/03/24  2017 05/04/24 0532  BP: (!) 108/49 137/74 (!) 118/56 (!) 111/59  Pulse: 94 92 96 83  Resp: 16 17 18 18   Temp: 97.9 F (36.6 C) 98 F (36.7 C) 98.8 F (37.1 C) 98.4 F (36.9 C)  TempSrc: Oral Oral Oral Oral  SpO2: 99%  100% 100%  Weight:      Height:       General: alert, cooperative, and no distress Lochia: appropriate Uterine Fundus: firm Incision: Healing well with no significant drainage DVT Evaluation: No evidence of DVT seen on physical exam. Labs: Lab Results  Component Value Date   WBC 11.4 (H) 05/03/2024   HGB 9.3 (L) 05/03/2024   HCT 27.3 (L) 05/03/2024   MCV 79.6 (L) 05/03/2024   PLT 186 05/03/2024      Latest Ref Rng & Units 05/03/2024    6:01 AM  CMP  Glucose 70 - 99 mg/dL 84    Edinburgh Score:    05/04/2024    6:00 AM  Edinburgh Postnatal Depression Scale Screening Tool  I have been able to laugh and see the funny side of things. 0  I have looked forward with enjoyment to things. 0  I have blamed myself unnecessarily when things went wrong. 2  I have been anxious or worried for no good reason. 2  I have felt scared or panicky for no good reason. 2  Things have been getting on top of me. 1  I have been  so unhappy that I have had difficulty sleeping. 0  I have felt sad or miserable. 1  I have been so unhappy that I have been crying. 1  The thought of harming myself has occurred to me. 0  Edinburgh Postnatal Depression Scale Total 9      After visit meds:  Allergies as of 05/04/2024       Reactions   Pertussis Vaccines Other (See Comments)   Both Brothers get seizures  Both Brothers get seizures Both Brothers get seizures Both Brothers get seizures, Both Brothers get seizures        Medication List     TAKE these medications    acetaminophen  325 MG tablet Commonly known as: TYLENOL  Take 2 tablets (650 mg total) by mouth every 6 (six) hours as needed for moderate pain (pain score 4-6).   albuterol  108 (90 Base) MCG/ACT  inhaler Commonly known as: VENTOLIN  HFA Inhale 2 puffs into the lungs every 4 (four) hours as needed.   cetirizine 10 MG tablet Commonly known as: ZYRTEC Take 10 mg by mouth daily.   ibuprofen  600 MG tablet Commonly known as: ADVIL  Take 1 tablet (600 mg total) by mouth every 6 (six) hours.   multivitamin-prenatal 27-0.8 MG Tabs tablet Take 1 tablet by mouth daily at 12 noon.   oxyCODONE  5 MG immediate release tablet Commonly known as: Oxy IR/ROXICODONE  Take 1 tablet (5 mg total) by mouth every 4 (four) hours as needed for moderate pain (pain score 4-6) or severe pain (pain score 7-10).   pantoprazole  40 MG tablet Commonly known as: PROTONIX  Take 1 tablet (40 mg total) by mouth daily. Start taking on: May 05, 2024   Pulmicort Flexhaler 180 MCG/ACT inhaler Generic drug: budesonide Inhale 1 puff into the lungs.   senna-docusate 8.6-50 MG tablet Commonly known as: Senokot-S Take 2 tablets by mouth daily. Start taking on: May 05, 2024         Discharge home in stable condition Infant Feeding: Breast Infant Disposition:home with mother Discharge instruction: per After Visit Summary and Postpartum booklet. Activity: Advance as tolerated. Pelvic rest for 6 weeks.  Diet: routine diet Anticipated Birth Control: Unsure Postpartum Appointment:6 weeks Additional Postpartum F/U: Incision check 1 week Future Appointments:No future appointments. Follow up Visit:  Follow-up Information     [facility] Schedule an appointment as soon as possible for a visit in 1 week(s).   Why: Incision check Contact information: [address]  [address] [place] [address] [phone]                     05/04/2024 Rosaline FORBES Chapel, MD   "

## 2024-05-09 ENCOUNTER — Telehealth (HOSPITAL_COMMUNITY): Payer: Self-pay | Admitting: *Deleted

## 2024-05-09 NOTE — Telephone Encounter (Signed)
 05/09/2024  Name: Brooke Oconnor MRN: [id number] DOB: [birth year]  Reason for Call:  Transition of Care Hospital Discharge Call  Contact Status: Patient Contact Status: Message  Language assistant needed:          Follow-Up Questions:    Van Postnatal Depression Scale:  In the Past 7 Days:    PHQ2-9 Depression Scale:     Discharge Follow-up:    Post-discharge interventions: NA  Mliss Sieve, RN 05/09/2024 13:35
